# Patient Record
Sex: Male | Born: 1960 | Race: White | Hispanic: No | Marital: Married | Smoking: Never smoker
Health system: Southern US, Community
[De-identification: ages and names within clinical notes are randomized; demographics above are authoritative.]

## PROBLEM LIST (undated history)

## (undated) DIAGNOSIS — R000 Tachycardia, unspecified: Secondary | ICD-10-CM

## (undated) DIAGNOSIS — I340 Nonrheumatic mitral (valve) insufficiency: Secondary | ICD-10-CM

## (undated) DIAGNOSIS — Z86711 Personal history of pulmonary embolism: Secondary | ICD-10-CM

## (undated) DIAGNOSIS — R05 Cough: Secondary | ICD-10-CM

## (undated) DIAGNOSIS — R1013 Epigastric pain: Secondary | ICD-10-CM

## (undated) DIAGNOSIS — K759 Inflammatory liver disease, unspecified: Secondary | ICD-10-CM

## (undated) DIAGNOSIS — H9319 Tinnitus, unspecified ear: Secondary | ICD-10-CM

## (undated) DIAGNOSIS — K219 Gastro-esophageal reflux disease without esophagitis: Secondary | ICD-10-CM

## (undated) DIAGNOSIS — Z860100 Personal history of colon polyps, unspecified: Secondary | ICD-10-CM

## (undated) DIAGNOSIS — R059 Cough, unspecified: Secondary | ICD-10-CM

## (undated) DIAGNOSIS — R011 Cardiac murmur, unspecified: Secondary | ICD-10-CM

## (undated) DIAGNOSIS — J45909 Unspecified asthma, uncomplicated: Secondary | ICD-10-CM

## (undated) DIAGNOSIS — R12 Heartburn: Secondary | ICD-10-CM

## (undated) DIAGNOSIS — Z9889 Other specified postprocedural states: Secondary | ICD-10-CM

## (undated) DIAGNOSIS — Z9289 Personal history of other medical treatment: Secondary | ICD-10-CM

## (undated) HISTORY — DX: Personal history of other medical treatment: Z92.89

## (undated) HISTORY — DX: Cardiac murmur, unspecified: R01.1

## (undated) HISTORY — DX: Tachycardia, unspecified: R00.0

## (undated) HISTORY — DX: Epigastric pain: R10.13

## (undated) HISTORY — DX: Tinnitus, unspecified ear: H93.19

## (undated) HISTORY — PX: FINGER SURGERY: SHX640

## (undated) HISTORY — DX: Personal history of colonic polyps: Z86.010

## (undated) HISTORY — DX: Personal history of pulmonary embolism: Z86.711

## (undated) HISTORY — DX: Cough, unspecified: R05.9

## (undated) HISTORY — PX: SHOULDER SURGERY: SHX246

## (undated) HISTORY — DX: Heartburn: R12

## (undated) HISTORY — PX: VASECTOMY: SHX75

## (undated) HISTORY — DX: Unspecified asthma, uncomplicated: J45.909

## (undated) HISTORY — DX: Personal history of colon polyps, unspecified: Z86.0100

---
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
## 2019-06-15 ENCOUNTER — Other Ambulatory Visit: Payer: Self-pay

## 2019-06-15 ENCOUNTER — Other Ambulatory Visit (HOSPITAL_COMMUNITY)
Admission: RE | Admit: 2019-06-15 | Discharge: 2019-06-15 | Disposition: A | Discharge status: Home / Self Care | Payer: PRIVATE HEALTH INSURANCE | Source: Ambulatory Visit | Attending: Interventional Cardiology | Admitting: Interventional Cardiology

## 2019-06-15 ENCOUNTER — Encounter

## 2019-06-15 ENCOUNTER — Ambulatory Visit (INDEPENDENT_AMBULATORY_CARE_PROVIDER_SITE_OTHER): Payer: PRIVATE HEALTH INSURANCE | Admitting: Interventional Cardiology

## 2019-06-15 ENCOUNTER — Encounter (INDEPENDENT_AMBULATORY_CARE_PROVIDER_SITE_OTHER): Payer: Self-pay

## 2019-06-15 VITALS — BP 132/73 | HR 73 | Ht 75.0 in | Wt 210.8 lb

## 2019-06-15 DIAGNOSIS — Z7189 Other specified counseling: Secondary | ICD-10-CM

## 2019-06-15 DIAGNOSIS — Z20828 Contact with and (suspected) exposure to other viral communicable diseases: Secondary | ICD-10-CM | POA: Insufficient documentation

## 2019-06-15 DIAGNOSIS — Z01812 Encounter for preprocedural laboratory examination: Secondary | ICD-10-CM | POA: Insufficient documentation

## 2019-06-15 DIAGNOSIS — R0602 Shortness of breath: Secondary | ICD-10-CM | POA: Diagnosis not present

## 2019-06-15 DIAGNOSIS — I2782 Chronic pulmonary embolism: Secondary | ICD-10-CM

## 2019-06-15 DIAGNOSIS — I340 Nonrheumatic mitral (valve) insufficiency: Secondary | ICD-10-CM

## 2019-06-15 DIAGNOSIS — J4520 Mild intermittent asthma, uncomplicated: Secondary | ICD-10-CM | POA: Diagnosis not present

## 2019-06-15 LAB — SARS CORONAVIRUS 2 (TAT 6-24 HRS): SARS Coronavirus 2: NEGATIVE

## 2019-06-15 NOTE — Patient Instructions (Addendum)
Medication Instructions:  Your physician recommends that you continue on your current medications as directed. Please refer to the Current Medication list given to you today.  If you need a refill on your cardiac medications before your next appointment, please call your pharmacy.   Lab work: BMET, CBC and Pro BNP today  If you have labs (blood work) drawn today and your tests are completely normal, you will receive your results only by: Marland Kitchen MyChart Message (if you have MyChart) OR . A paper copy in the mail If you have any lab test that is abnormal or we need to change your treatment, we will call you to review the results.  Testing/Procedures: Your physician has requested that you have a cardiac catheterization. Cardiac catheterization is used to diagnose and/or treat various heart conditions. Doctors may recommend this procedure for a number of different reasons. The most common reason is to evaluate chest pain. Chest pain can be a symptom of coronary artery disease (CAD), and cardiac catheterization can show whether plaque is narrowing or blocking your heart's arteries. This procedure is also used to evaluate the valves, as well as measure the blood flow and oxygen levels in different parts of your heart. For further information please visit [email or web address]. Please follow instruction sheet, as given.   Follow-Up: At [facility], you and your health needs are our priority.  As part of our continuing mission to provide you with exceptional heart care, we have created designated Provider Care Teams.  These Care Teams include your primary Cardiologist (physician) and Advanced Practice Providers (APPs -  Physician Assistants and Nurse Practitioners) who all work together to provide you with the care you need, when you need it. You will need a follow up appointment in 1 month.  Please call our office 2 months in advance to schedule this appointment.  You may see Dr. Ericka Pontiff or one of the  following Advanced Practice Providers on your designated Care Team:   Truitt Merle, NP Cecilie Kicks, NP . Kathyrn Drown, NP  Any Other Special Instructions Will Be Listed Below (If Applicable).  You have been referred to Dr. Lanelle Bal at [facility].  You will need to see them in about 10 days. (Make sure after heart cath).       [place] OFFICE [place], [place] [address] Dept: [phone] Loc: [phone]  CLIVE PARCEL  06/15/2019  You are scheduled for a Cardiac Catheterization on Wednesday, September 30 with Dr. Daneen Schick.  1. Please arrive at the [facility] (Main Entrance A) at [facility]: [address] [place], [place] [address] at 8:00 AM (This time is two hours before your procedure to ensure your preparation). Free valet parking service is available.   Special note: Every effort is made to have your procedure done on time. Please understand that emergencies sometimes delay scheduled procedures.  2. Diet: Do not eat solid foods after midnight.  The patient may have clear liquids until 5am upon the day of the procedure.  3. Labs: You will have labs drawn today.  4. Medication instructions in preparation for your procedure:   Contrast Allergy: No  On the morning of your procedure, take your Aspirin and any morning medicines NOT listed above.  You may use sips of water.  5. Plan for one night stay--bring personal belongings. 6. Bring a current list of your medications and current insurance cards. 7. You  MUST have a responsible person to drive you home. 8. Someone MUST be with you the first 24 hours after you arrive home or your discharge will be delayed. 9. Please wear clothes that are easy to get on and off and wear slip-on shoes.  Thank you for allowing [place] to care for you!   -- [facility]

## 2019-06-16 ENCOUNTER — Telehealth: Payer: Self-pay | Admitting: *Deleted

## 2019-06-16 LAB — CBC
Hematocrit: 49.6 % (ref 37.5–51.0)
Hemoglobin: 16.7 g/dL (ref 13.0–17.7)
MCH: 28.3 pg (ref 26.6–33.0)
MCHC: 33.7 g/dL (ref 31.5–35.7)
MCV: 84 fL (ref 79–97)
Platelets: 326 10*3/uL (ref 150–450)
RBC: 5.91 x10E6/uL — ABNORMAL HIGH (ref 4.14–5.80)
RDW: 13.4 % (ref 11.6–15.4)
WBC: 7.9 10*3/uL (ref 3.4–10.8)

## 2019-06-16 LAB — BASIC METABOLIC PANEL
BUN/Creatinine Ratio: 12 (ref 9–20)
BUN: 11 mg/dL (ref 6–24)
CO2: 21 mmol/L (ref 20–29)
Calcium: 9.7 mg/dL (ref 8.7–10.2)
Chloride: 102 mmol/L (ref 96–106)
Creatinine, Ser: 0.94 mg/dL (ref 0.76–1.27)
GFR calc Af Amer: 103 mL/min/{1.73_m2} (ref >59)
GFR calc non Af Amer: 89 mL/min/{1.73_m2} (ref >59)
Glucose: 89 mg/dL (ref 65–99)
Potassium: 4.7 mmol/L (ref 3.5–5.2)
Sodium: 140 mmol/L (ref 134–144)

## 2019-06-16 LAB — PRO B NATRIURETIC PEPTIDE: NT-Pro BNP: 39 pg/mL (ref 0–210)

## 2019-06-16 NOTE — Telephone Encounter (Signed)
Pt contacted pre-catheterization scheduled at [facility] for: Wednesday June 17, 2019 10 AM Verified arrival time and place: [facility]) at: 8 AM   No solid food after midnight prior to cath, clear liquids until 5 AM day of procedure. Contrast allergy: no   AM meds can be  taken pre-cath with sip of water including: ASA 81 mg   Confirmed patient has responsible person to drive home post procedure and observe 24 hours after arriving home: yes  Currently, due to Covid-19 pandemic, only one support person will be allowed with patient. Must be the same support person for that patient's entire stay, will be screened and required to wear a mask. They will be asked to wait in the waiting room for the duration of the patient's stay.  Patients are required to wear a mask when they enter the hospital.     COVID-19 Pre-Screening Questions:  . In the past 7 to 10 days have you had a cough,  shortness of breath, headache, congestion, fever (100 or greater) body aches, chills, sore throat, or sudden loss of taste or sense of smell? Asthma/shortness of breath . Have you been around anyone with known Covid 19? no . Have you been around anyone who is awaiting Covid 19 test results in the past 7 to 10 days? no . Have you been around anyone who has been exposed to Covid 19, or has mentioned symptoms of Covid 19 within the past 7 to 10 days? no  I reviewed procedure/mask/visitor instructions, Covid-19 screening questions with patient's wife (DPR), she verbalized understanding, thanked me for call.

## 2019-06-17 ENCOUNTER — Encounter (HOSPITAL_COMMUNITY)
Admission: RE | Disposition: A | Discharge status: Home / Self Care | Payer: Self-pay | Source: Home / Self Care | Attending: Interventional Cardiology

## 2019-06-17 ENCOUNTER — Other Ambulatory Visit: Payer: Self-pay

## 2019-06-17 ENCOUNTER — Ambulatory Visit (HOSPITAL_COMMUNITY)
Admission: RE | Admit: 2019-06-17 | Discharge: 2019-06-17 | Disposition: A | Discharge status: Home / Self Care | Payer: PRIVATE HEALTH INSURANCE | Attending: Interventional Cardiology | Admitting: Interventional Cardiology

## 2019-06-17 ENCOUNTER — Encounter (HOSPITAL_COMMUNITY): Payer: Self-pay | Admitting: Interventional Cardiology

## 2019-06-17 DIAGNOSIS — Z7951 Long term (current) use of inhaled steroids: Secondary | ICD-10-CM | POA: Insufficient documentation

## 2019-06-17 DIAGNOSIS — J4520 Mild intermittent asthma, uncomplicated: Secondary | ICD-10-CM | POA: Diagnosis not present

## 2019-06-17 DIAGNOSIS — R0609 Other forms of dyspnea: Secondary | ICD-10-CM | POA: Diagnosis not present

## 2019-06-17 DIAGNOSIS — I340 Nonrheumatic mitral (valve) insufficiency: Secondary | ICD-10-CM

## 2019-06-17 DIAGNOSIS — I2782 Chronic pulmonary embolism: Secondary | ICD-10-CM | POA: Insufficient documentation

## 2019-06-17 DIAGNOSIS — I5032 Chronic diastolic (congestive) heart failure: Secondary | ICD-10-CM | POA: Diagnosis not present

## 2019-06-17 HISTORY — PX: RIGHT/LEFT HEART CATH AND CORONARY ANGIOGRAPHY: CATH118266

## 2019-06-17 LAB — POCT I-STAT EG7
Acid-base deficit: 1 mmol/L (ref 0.0–2.0)
Acid-base deficit: 1 mmol/L (ref 0.0–2.0)
Bicarbonate: 24.9 mmol/L (ref 20.0–28.0)
Bicarbonate: 25 mmol/L (ref 20.0–28.0)
Calcium, Ion: 1.2 mmol/L (ref 1.15–1.40)
Calcium, Ion: 1.21 mmol/L (ref 1.15–1.40)
HCT: 44 % (ref 39.0–52.0)
HCT: 44 % (ref 39.0–52.0)
Hemoglobin: 15 g/dL (ref 13.0–17.0)
Hemoglobin: 15 g/dL (ref 13.0–17.0)
O2 Saturation: 79 %
O2 Saturation: 82 %
Potassium: 4.2 mmol/L (ref 3.5–5.1)
Potassium: 4.2 mmol/L (ref 3.5–5.1)
Sodium: 141 mmol/L (ref 135–145)
Sodium: 141 mmol/L (ref 135–145)
TCO2: 26 mmol/L (ref 22–32)
TCO2: 26 mmol/L (ref 22–32)
pCO2, Ven: 45.1 mmHg (ref 44.0–60.0)
pCO2, Ven: 45.4 mmHg (ref 44.0–60.0)
pH, Ven: 7.347 (ref 7.250–7.430)
pH, Ven: 7.352 (ref 7.250–7.430)
pO2, Ven: 46 mmHg — ABNORMAL HIGH (ref 32.0–45.0)
pO2, Ven: 49 mmHg — ABNORMAL HIGH (ref 32.0–45.0)

## 2019-06-17 LAB — POCT I-STAT 7, (LYTES, BLD GAS, ICA,H+H)
Acid-base deficit: 1 mmol/L (ref 0.0–2.0)
Bicarbonate: 24.6 mmol/L (ref 20.0–28.0)
Calcium, Ion: 1.18 mmol/L (ref 1.15–1.40)
HCT: 42 % (ref 39.0–52.0)
Hemoglobin: 14.3 g/dL (ref 13.0–17.0)
O2 Saturation: 97 %
Potassium: 4.1 mmol/L (ref 3.5–5.1)
Sodium: 137 mmol/L (ref 135–145)
TCO2: 26 mmol/L (ref 22–32)
pCO2 arterial: 44.8 mmHg (ref 32.0–48.0)
pH, Arterial: 7.348 — ABNORMAL LOW (ref 7.350–7.450)
pO2, Arterial: 98 mmHg (ref 83.0–108.0)

## 2019-06-17 SURGERY — RIGHT/LEFT HEART CATH AND CORONARY ANGIOGRAPHY
Anesthesia: LOCAL

## 2019-06-17 MED ORDER — ACETAMINOPHEN 325 MG PO TABS: 650.0000 mg | ORAL_TABLET | ORAL | Status: DC | PRN

## 2019-06-17 MED ORDER — ASPIRIN 81 MG PO CHEW: 81.0000 mg | CHEWABLE_TABLET | ORAL | Status: DC

## 2019-06-17 MED ORDER — SODIUM CHLORIDE 0.9% FLUSH: 3.0000 mL | Freq: Two times a day (BID) | INTRAVENOUS | Status: DC

## 2019-06-17 MED ORDER — LIDOCAINE HCL (PF) 1 % IJ SOLN
INTRAMUSCULAR | Status: DC | PRN
  Administered 2019-06-17 (×2): 2 mL via INTRADERMAL

## 2019-06-17 MED ORDER — HEPARIN (PORCINE) IN NACL 1000-0.9 UT/500ML-% IV SOLN
INTRAVENOUS | Status: AC
  Filled 2019-06-17: qty 500

## 2019-06-17 MED ORDER — HYDRALAZINE HCL 20 MG/ML IJ SOLN: 10.0000 mg | INTRAMUSCULAR | Status: DC | PRN

## 2019-06-17 MED ORDER — VERAPAMIL HCL 2.5 MG/ML IV SOLN
INTRAVENOUS | Status: DC | PRN
  Administered 2019-06-17: 11:00:00 10 mL via INTRA_ARTERIAL

## 2019-06-17 MED ORDER — FENTANYL CITRATE (PF) 100 MCG/2ML IJ SOLN
INTRAMUSCULAR | Status: AC
  Filled 2019-06-17: qty 2

## 2019-06-17 MED ORDER — SODIUM CHLORIDE 0.9 % IV SOLN: 250.0000 mL | INTRAVENOUS | Status: DC | PRN

## 2019-06-17 MED ORDER — SODIUM CHLORIDE 0.9% FLUSH: 3.0000 mL | INTRAVENOUS | Status: DC | PRN

## 2019-06-17 MED ORDER — IOHEXOL 350 MG/ML SOLN
INTRAVENOUS | Status: DC | PRN
  Administered 2019-06-17: 11:00:00 120 mL via INTRACARDIAC

## 2019-06-17 MED ORDER — VERAPAMIL HCL 2.5 MG/ML IV SOLN
INTRAVENOUS | Status: AC
  Filled 2019-06-17: qty 2

## 2019-06-17 MED ORDER — ONDANSETRON HCL 4 MG/2ML IJ SOLN: 4.0000 mg | Freq: Four times a day (QID) | INTRAMUSCULAR | Status: DC | PRN

## 2019-06-17 MED ORDER — MIDAZOLAM HCL 2 MG/2ML IJ SOLN
INTRAMUSCULAR | Status: DC | PRN
  Administered 2019-06-17 (×3): 1 mg via INTRAVENOUS

## 2019-06-17 MED ORDER — SODIUM CHLORIDE 0.9 % WEIGHT BASED INFUSION
3.0000 mL/kg/h | INTRAVENOUS | Status: AC
  Administered 2019-06-17: 09:00:00 3 mL/kg/h via INTRAVENOUS

## 2019-06-17 MED ORDER — SODIUM CHLORIDE 0.9 % WEIGHT BASED INFUSION: 1.0000 mL/kg/h | INTRAVENOUS | Status: DC

## 2019-06-17 MED ORDER — LABETALOL HCL 5 MG/ML IV SOLN: 10.0000 mg | INTRAVENOUS | Status: DC | PRN

## 2019-06-17 MED ORDER — HEPARIN (PORCINE) IN NACL 1000-0.9 UT/500ML-% IV SOLN
INTRAVENOUS | Status: DC | PRN
  Administered 2019-06-17 (×2): 500 mL

## 2019-06-17 MED ORDER — FENTANYL CITRATE (PF) 100 MCG/2ML IJ SOLN
INTRAMUSCULAR | Status: DC | PRN
  Administered 2019-06-17 (×2): 25 ug via INTRAVENOUS

## 2019-06-17 MED ORDER — HEPARIN SODIUM (PORCINE) 1000 UNIT/ML IJ SOLN
INTRAMUSCULAR | Status: AC
  Filled 2019-06-17: qty 1

## 2019-06-17 MED ORDER — HEPARIN SODIUM (PORCINE) 1000 UNIT/ML IJ SOLN
INTRAMUSCULAR | Status: DC | PRN
  Administered 2019-06-17: 5000 [IU] via INTRAVENOUS

## 2019-06-17 MED ORDER — SODIUM CHLORIDE 0.9 % IV SOLN: INTRAVENOUS | Status: DC

## 2019-06-17 MED ORDER — MIDAZOLAM HCL 2 MG/2ML IJ SOLN
INTRAMUSCULAR | Status: AC
  Filled 2019-06-17: qty 2

## 2019-06-17 MED ORDER — LIDOCAINE HCL (PF) 1 % IJ SOLN
INTRAMUSCULAR | Status: AC
  Filled 2019-06-17: qty 30

## 2019-06-17 SURGICAL SUPPLY — 13 items
CATH 5FR JL3.5 JR4 ANG PIG MP (CATHETERS) ×2 IMPLANT
CATH BALLN WEDGE 5F 110CM (CATHETERS) ×2 IMPLANT
DEVICE RAD COMP TR BAND LRG (VASCULAR PRODUCTS) ×2 IMPLANT
GLIDESHEATH SLEND A-KIT 6F 22G (SHEATH) ×2 IMPLANT
GUIDEWIRE INQWIRE 1.5J.035X260 (WIRE) ×1 IMPLANT
INQWIRE 1.5J .035X260CM (WIRE) ×2
KIT HEART LEFT (KITS) ×2 IMPLANT
PACK CARDIAC CATHETERIZATION (CUSTOM PROCEDURE TRAY) ×2 IMPLANT
SHEATH GLIDE SLENDER 4/5FR (SHEATH) ×2 IMPLANT
SHEATH PROBE COVER 6X72 (BAG) ×2 IMPLANT
TRANSDUCER W/STOPCOCK (MISCELLANEOUS) ×2 IMPLANT
TUBING CIL FLEX 10 FLL-RA (TUBING) ×2 IMPLANT
WIRE EMERALD 3MM-J .035X150CM (WIRE) IMPLANT

## 2019-06-17 NOTE — CV Procedure (Addendum)
   Right and left heart catheterization with coronary angiography using right antecubital vein and radial artery.  Real-time vascular ultrasound used for arterial access.  Normal coronary arteries.  Normal pulmonary artery pressures.  No V wave.  Pulmonary capillary wedge mean pressure 6 mmHg.  Normal left ventricular systolic function.  LVEDP 7 mmHg.  EF 60%.  Mitral regurgitation appears mild to moderate angiographically..  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  Left atrium noted to be mildly dilated.  Will refer to Dr. Roxy Manns for consideration of valve repair given the patient's significant exertional dyspnea which has no other explanation than MR. He may need concurrent pulmonary evaluation to be certain that dyspnea is correctly attributed.

## 2019-06-17 NOTE — Research (Signed)
[place] Informed Consent   Subject Name: Nathan [name]  Subject met inclusion and exclusion criteria.  The informed consent form, study requirements and expectations were reviewed with the subject and questions and concerns were addressed prior to the signing of the consent form.  The subject verbalized understanding of the trial requirements.  The subject agreed to participate in the [facility] trial and signed the informed consent at 0925 on 06/17/2019.  The informed consent was obtained prior to performance of any protocol-specific procedures for the subject.  A copy of the signed informed consent was given to the subject and a copy was placed in the subject's medical record.   [name]

## 2019-06-17 NOTE — Interval H&P Note (Signed)
Cath Lab Visit (complete for each Cath Lab visit)  Clinical Evaluation Leading to the Procedure:   ACS: No.  Non-ACS:    Anginal Classification: No Symptoms  Anti-ischemic medical therapy: No Therapy  Non-Invasive Test Results: No non-invasive testing performed  Prior CABG: No previous CABG      History and Physical Interval Note:  06/17/2019 10:20 AM  Nathan [name]  has presented today for surgery, with the diagnosis of shortness of breath.  The various methods of treatment have been discussed with the patient and family. After consideration of risks, benefits and other options for treatment, the patient has consented to  Procedure(s): RIGHT/LEFT HEART CATH AND CORONARY ANGIOGRAPHY (N/A) as a surgical intervention.  The patient's history has been reviewed, patient examined, no change in status, stable for surgery.  I have reviewed the patient's chart and labs.  Questions were answered to the patient's satisfaction.     Belva Crome III

## 2019-06-17 NOTE — Discharge Instructions (Signed)
Drink plenty of fluids °Keep right arm at or above heart level  °Radial Site Care ° °This sheet gives you information about how to care for yourself after your procedure. Your health care provider may also give you more specific instructions. If you have problems or questions, contact your health care provider. °What can I expect after the procedure? °After the procedure, it is common to have: °· Bruising and tenderness at the catheter insertion area. °Follow these instructions at home: °Medicines °· Take over-the-counter and prescription medicines only as told by your health care provider. °Insertion site care °· Follow instructions from your health care provider about how to take care of your insertion site. Make sure you: °? Wash your hands with soap and water before you change your bandage (dressing). If soap and water are not available, use hand sanitizer. °? Change your dressing as told by your health care provider. °? Leave stitches (sutures), skin glue, or adhesive strips in place. These skin closures may need to stay in place for 2 weeks or longer. If adhesive strip edges start to loosen and curl up, you may trim the loose edges. Do not remove adhesive strips completely unless your health care provider tells you to do that. °· Check your insertion site every day for signs of infection. Check for: °? Redness, swelling, or pain. °? Fluid or blood. °? Pus or a bad smell. °? Warmth. °· Do not take baths, swim, or use a hot tub until your health care provider approves. °· You may shower 24-48 hours after the procedure, or as directed by your health care provider. °? Remove the dressing and gently wash the site with plain soap and water. °? Pat the area dry with a clean towel. °? Do not rub the site. That could cause bleeding. °· Do not apply powder or lotion to the site. °Activity ° °· For 24 hours after the procedure, or as directed by your health care provider: °? Do not flex or bend the affected arm. °? Do  not push or pull heavy objects with the affected arm. °? Do not drive yourself home from the hospital or clinic. You may drive 24 hours after the procedure unless your health care provider tells you not to. °? Do not operate machinery or power tools. °· Do not lift anything that is heavier than 10 lb (4.5 kg), or the limit that you are told, until your health care provider says that it is safe. °· Ask your health care provider when it is okay to: °? Return to work or school. °? Resume usual physical activities or sports. °? Resume sexual activity. °General instructions °· If the catheter site starts to bleed, raise your arm and put firm pressure on the site. If the bleeding does not stop, get help right away. This is a medical emergency. °· If you went home on the same day as your procedure, a responsible adult should be with you for the first 24 hours after you arrive home. °· Keep all follow-up visits as told by your health care provider. This is important. °Contact a health care provider if: °· You have a fever. °· You have redness, swelling, or yellow drainage around your insertion site. °Get help right away if: °· You have unusual pain at the radial site. °· The catheter insertion area swells very fast. °· The insertion area is bleeding, and the bleeding does not stop when you hold steady pressure on the area. °· Your arm or hand   becomes pale, cool, tingly, or numb. °These symptoms may represent a serious problem that is an emergency. Do not wait to see if the symptoms will go away. Get medical help right away. Call your local emergency services (911 in the [place] Do not drive yourself to the hospital. °Summary °· After the procedure, it is common to have bruising and tenderness at the site. °· Follow instructions from your health care provider about how to take care of your radial site wound. Check the wound every day for signs of infection. °· Do not lift anything that is heavier than 10 lb (4.5 kg), or the  limit that you are told, until your health care provider says that it is safe. °This information is not intended to replace advice given to you by your health care provider. Make sure you discuss any questions you have with your health care provider. °Document Released: 10/06/2010 Document Revised: 10/09/2017 Document Reviewed: 10/09/2017 °Elsevier Patient Education © 2020 [employer] ° °

## 2019-06-18 ENCOUNTER — Encounter: Payer: Self-pay | Admitting: Thoracic Surgery (Cardiothoracic Vascular Surgery)

## 2019-06-18 ENCOUNTER — Other Ambulatory Visit: Payer: Self-pay | Admitting: Thoracic Surgery (Cardiothoracic Vascular Surgery)

## 2019-06-18 ENCOUNTER — Other Ambulatory Visit: Payer: Self-pay | Admitting: *Deleted

## 2019-06-18 ENCOUNTER — Institutional Professional Consult (permissible substitution) (INDEPENDENT_AMBULATORY_CARE_PROVIDER_SITE_OTHER): Payer: PRIVATE HEALTH INSURANCE | Admitting: Thoracic Surgery (Cardiothoracic Vascular Surgery)

## 2019-06-18 ENCOUNTER — Other Ambulatory Visit: Payer: Self-pay

## 2019-06-18 ENCOUNTER — Encounter: Payer: PRIVATE HEALTH INSURANCE | Admitting: Cardiothoracic Surgery

## 2019-06-18 VITALS — BP 143/85 | HR 58 | Temp 98.1°F | Resp 18 | Ht 75.0 in | Wt 205.0 lb

## 2019-06-18 DIAGNOSIS — I340 Nonrheumatic mitral (valve) insufficiency: Secondary | ICD-10-CM

## 2019-06-18 NOTE — Patient Instructions (Signed)
   Continue taking all current medications without change through the day before surgery.  Have nothing to eat or drink after midnight the night before surgery.  On the morning of surgery take only Protonix with a sip of water.  You may use your inhaler.

## 2019-06-18 NOTE — Progress Notes (Signed)
° ° °   °[address] °      [place] [address] °            [phone]   ° ° °CARDIOTHORACIC SURGERY CONSULTATION REPORT ° °Referring Provider is Smith, Henry W, MD °PCP is Hollandsworth, James, MD ° °Chief Complaint  °Patient presents with  °• Mitral Regurgitation  °  new patient consultation, Cath 9/30, ECHO 6/5  ° ° °HPI: ° °Patient is a 58-year-old male with mitral valve prolapse who has been referred for surgical consultation to discuss treatment options for management of severe symptomatic primary mitral regurgitation. ° °Patient states that he first began to experience symptoms of exertional shortness of breath 4 or 5 years ago.  Symptoms developed somewhat suddenly and have persisted and gradually gotten worse.  The patient is fairly active physically but has noticed a definite decline in his tolerance with a tendency to get breathless during activity.  He has a persistent cough that is described as "hacky" and occasionally productive of thick sputum.  He reports occasional vague discomfort in his chest primarily associated with a sensation that is heart is pounding or racing.  He denies resting shortness of breath, PND, orthopnea, or lower extremity edema.  He has occasional very slight dizzy spells.  More than 6 months ago he was seen at his primary care physician's office by a provider who noted the presence of a systolic murmur on physical exam.  The patient underwent transthoracic and transesophageal echocardiograms at [facility] which revealed mitral valve prolapse with severe mitral regurgitation and normal left ventricular systolic function.  The patient ultimately was self-referred to our practice and underwent diagnostic cardiac catheterization by Dr. Smith on June 17, 2019.  Catheterization revealed normal coronary artery anatomy with no significant coronary artery disease.  There was at least moderate mitral regurgitation.  Right heart pressures were  normal.  Cardiothoracic surgical consultation was requested. ° °Patient is married and lives with his wife in [place].  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  This work keeps him quite busy with long days.  He does not exercise otherwise on his own but he does remain physically active and reports no other significant physical limitations. ° °Past Medical History:  °Diagnosis Date  °• Allergic asthma   °• Cough   °• Epigastric pain   °• Fast heart beat   °• Heartburn   °• History of pulmonary embolus (PE)   °• History of transesophageal echocardiography (TEE)   ° ABNORMAL  °• Hx of colonic polyps   °• Murmur, cardiac   °• Ringing in ear   ° ° °Past Surgical History:  °Procedure Laterality Date  °• FINGER SURGERY    °• RIGHT/LEFT HEART CATH AND CORONARY ANGIOGRAPHY N/A 06/17/2019  ° Procedure: RIGHT/LEFT HEART CATH AND CORONARY ANGIOGRAPHY;  Surgeon: Smith, Henry W, MD;  Location: [facility];  Service: Cardiovascular;  Laterality: N/A;  °• SHOULDER SURGERY Left   °• VASECTOMY    ° ° °Family History  °Problem Relation Age of Onset  °• Diabetes Mother   ° ° °Social History  ° °Socioeconomic History  °• Marital status: Married  °  Spouse name: Not on file  °• Number of children: Not on file  °• Years of education: Not on file  °• Highest education level: Not on file  °Occupational History  °• Not on file  °Social Needs  °•   Financial resource strain: Not on file  °• Food insecurity  °  Worry: Not on file  °  Inability: Not on file  °• Transportation needs  °  Medical: Not on file  °  Non-medical: Not on file  °Tobacco Use  °• Smoking status: Never Smoker  °• Smokeless tobacco: Never Used  °Substance and Sexual Activity  °• Alcohol use: Yes  °  Alcohol/week: 6.0 standard drinks  °  Types: 6 Glasses of wine per week  °• Drug use: Never  °• Sexual activity: Not on file  °  Comment: MARRIED  °Lifestyle  °• Physical activity  °  Days per week: Not on  file  °  Minutes per session: Not on file  °• Stress: Not on file  °Relationships  °• Social connections  °  Talks on phone: Not on file  °  Gets together: Not on file  °  Attends religious service: Not on file  °  Active member of club or organization: Not on file  °  Attends meetings of clubs or organizations: Not on file  °  Relationship status: Not on file  °• Intimate partner violence  °  Fear of current or ex partner: Not on file  °  Emotionally abused: Not on file  °  Physically abused: Not on file  °  Forced sexual activity: Not on file  °Other Topics Concern  °• Not on file  °Social History Narrative  °• Not on file  ° ° °Current Outpatient Medications  °Medication Sig Dispense Refill  °• budesonide (RHINOCORT AQUA) 32 MCG/ACT nasal spray Place 2 sprays into both nostrils daily.     °• budesonide-formoterol (SYMBICORT) 80-4.5 MCG/ACT inhaler Inhale 1-2 puffs into the lungs See admin instructions. Inhale 2 puffs in the morning, may inhale 1 puff during the day as needed for shortness of breath    °• Guaifenesin (MUCINEX MAXIMUM STRENGTH) 1200 MG TB12 Take 1,200 mg by mouth daily as needed (congestion).    °• omeprazole (PRILOSEC) 40 MG capsule Take 40 mg by mouth daily.    °• pantoprazole (PROTONIX) 40 MG tablet Take 40 mg by mouth daily.    ° °No current facility-administered medications for this visit.   ° ° °No Known Allergies ° ° ° °Review of Systems: ° ° General:  normal appetite, normal energy, no weight gain, no weight loss, no fever ° Cardiac:  no chest pain with exertion, no chest pain at rest, +SOB with exertion, no resting SOB, no PND, no orthopnea, + palpitations, no arrhythmia, no atrial fibrillation, no LE edema, no dizzy spells, no syncope ° Respiratory:  + shortness of breath, no home oxygen, occasional productive cough, chronic dry cough, no bronchitis, + wheezing, no hemoptysis, + asthma, no pain with inspiration or cough, no sleep apnea, no CPAP at night ° GI:   no difficulty swallowing, +  reflux, + frequent heartburn, no hiatal hernia, no abdominal pain, no constipation, no diarrhea, no hematochezia, no hematemesis, no melena ° GU:   no dysuria,  no frequency, no urinary tract infection, no hematuria, no enlarged prostate, no kidney stones, no kidney disease ° Vascular:  no pain suggestive of claudication, no pain in feet, no leg cramps, no varicose veins, no DVT, no non-healing foot ulcer ° Neuro:   no stroke, no TIA's, no seizures, no headaches, no temporary blindness one eye,  no slurred speech, no peripheral neuropathy, no chronic pain, no instability of gait, no memory/cognitive dysfunction ° Musculoskeletal: no   arthritis, no joint swelling, no myalgias, no difficulty walking, normal mobility  ° Skin:   no rash, no itching, no skin infections, no pressure sores or ulcerations ° Psych:   no anxiety, no depression, no nervousness, no unusual recent stress ° Eyes:   no blurry vision, + floaters, no recent vision changes, does not wear glasses or contacts ° ENT:   no hearing loss, no loose or painful teeth but one "cracked" tooth that is intermittently sensitive, no dentures, last saw dentist within the past year ° Hematologic:  no easy bruising, no abnormal bleeding, no clotting disorder, no frequent epistaxis ° Endocrine:  no diabetes, does not check CBG's at home ° °   °Physical Exam: ° ° BP (!) 143/85 (BP Location: Left Arm, Patient Position: Sitting, Cuff Size: Normal)    Pulse (!) 58    Temp 98.1 °F (36.7 °C)    Resp 18    Ht 6' 3" (1.905 m)    Wt 205 lb (93 kg)    SpO2 96% Comment: RA   BMI 25.62 kg/m²  ° General:    well-appearing ° HEENT:  Unremarkable  ° Neck:   no JVD, no bruits, no adenopathy  ° Chest:   clear to auscultation, symmetrical breath sounds, no wheezes, no rhonchi  ° CV:   RRR, grade III/VI holosystolic murmur best at apex ° Abdomen:  soft, non-tender, no masses  ° Extremities:  warm, well-perfused, pulses palpable, no LE edema ° Rectal/GU  Deferred ° Neuro:   Grossly  non-focal and symmetrical throughout ° Skin:   Clean and dry, no rashes, no breakdown ° ° °Diagnostic Tests: ° °ECHOCARDIOGRAMS ° °Both images and reports from transthoracic and transesophageal echocardiograms performed at [facility] in June 2020 have been reviewed.  The patient has normal left ventricular systolic function.  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  The jet of mitral regurgitation is eccentric and graded severe on transesophageal echocardiogram.  There appears to be ruptured primary chordae tendinae.  There is mild left atrial enlargement with normal left ventricular size and function.  No other significant abnormalities are noted. ° ° ° °RIGHT/LEFT HEART CATH AND CORONARY ANGIOGRAPHY  °Conclusion ° °· Mild to moderate mitral regurgitation by left ventriculography. °· Normal pulmonary artery hemodynamics including wedge pressure without significant V wave. °· Normal right dominant coronary anatomy. °· Normal coronary arteries. °· Normal LV function with EF at least 60%.  LVEDP is normal. °  °RECOMMENDATIONS: °  °· The patient does have significantly exertional dyspnea.  Dyspnea seems out of proportion to resting hemodynamics. °· Recent transesophageal echo revealed P2 prolapse with moderate to severe mitral regurgitation ([facility]). °· Plan to refer patient to Dr. Madisan Bice for further evaluation. °· May need pulmonary evaluation.  °Recommendations ° °Antiplatelet/Anticoag No indication for antiplatelet therapy at this time .  °Surgeon Notes ° °  06/17/2019 11:08 AM CV Procedure addendum by Smith, Henry W, MD  °Indications ° °Nonrheumatic mitral valve regurgitation [I34.0 (ICD-10-CM)]  °DOE (dyspnea on exertion) [R06.09 (ICD-10-CM)]  °Procedural Details ° °Technical Details The right radial area was sterilely prepped and  draped. Intravenous sedation with Versed and fentanyl was administered. 1% Xylocaine was infiltrated to achieve local analgesia. Using real-time vascular ultrasound, a double wall stick with an angiocath was utilized to obtain intra-arterial access. A VUS image was saved for the permanent record.The modified   Seldinger technique was used to place a 5F " Slender" sheath in the right radial artery. Weight based heparin was administered. Coronary angiography was done using 5 F catheters. Right coronary angiography was performed with a JR4. Left coronary angiography was performed with a JL 3.5 cm.  After coronary angiography, an angled 5 French pigtail was advanced across the aortic valve.  Hemodynamic recordings were performed.  Left ventriculography in the 30 degree RAO projection was performed using 18 cc of contrast for a total of 45 cc.  Mild to moderate mitral regurgitation was noted.  Ventricular ectopy did occur during injection.  An aortic arterial saturation was also performed. ° °Right heart catheterization was performed by exchanging a previously placed antecubital IV angio-cath for a 5 French Slender sheath. 1% Xylocaine was used to locally nesthetize the area around the IV site. The IV catheter was wired using an .018 guidewire. The modified Seldinger technique was used to place the 5 French sheath. Double glove technique was used to enhance sterility. After sheath insertion, right heart cath was performed using a 5 French balloon tipped catheter and fluoroscopic guidance. Pressures were recorded in each chamber and in the pulmonary capillary wedge position.. The main pulmonary artery O2 saturation was sampled.  ° °Hemostasis was achieved using a pneumatic band. ° °During this procedure the patient is administered a total of Versed 3 mg and Fentanyl 75 mcg to achieve and maintain moderate conscious sedation.  The patient's heart rate, blood pressure, and oxygen saturation are monitored continuously during  the procedure. The period of conscious sedation is 45 minutes, of which I was present face-to-face 100% of this time. °Estimated blood loss <50 mL.  ° °During this procedure medications were administered to achieve and maintain moderate conscious sedation while the patient's heart rate, blood pressure, and oxygen saturation were continuously monitored and I was present face-to-face 100% of this time.  °Medications °(Filter: Administrations occurring from 06/17/19 1002 to 06/17/19 1106) °(important)  Continuous medications are totaled by the amount administered until 06/17/19 1106.  °Medication Rate/Dose/Volume Action  Date Time   °fentaNYL (SUBLIMAZE) injection (mcg) 25 mcg Given 06/17/19 1016   °Total dose as of 06/17/19 1106 25 mcg Given 1030   °50 mcg        °midazolam (VERSED) injection (mg) 1 mg Given 06/17/19 1017   °Total dose as of 06/17/19 1106 1 mg Given 1029   °3 mg 1 mg Given 1043   °Heparin (Porcine) in NaCl 1000-0.9 UT/500ML-% SOLN (mL) 500 mL Given 06/17/19 1017   °Total dose as of 06/17/19 1106 500 mL Given 1017   °1,000 mL        °lidocaine (PF) (XYLOCAINE) 1 % injection (mL) 2 mL Given 06/17/19 1030   °Total dose as of 06/17/19 1106 2 mL Given 1031   °4 mL        °Radial Cocktail/Verapamil only (mL) 10 mL Given 06/17/19 1034   °Total dose as of 06/17/19 1106        °10 mL        °heparin injection (Units) 5,000 Units Given 06/17/19 1041   °Total dose as of 06/17/19 1106        °5,000 Units        °iohexol (OMNIPAQUE) 350 MG/ML injection (mL) 120 mL Given 06/17/19 1103   °Total dose as of 06/17/19 1106        °120 mL        °Sedation Time ° °Sedation Time Physician-1: 43 minutes 15   seconds  °Contrast ° °Medication Name Total Dose  °iohexol (OMNIPAQUE) 350 MG/ML injection 120 mL  °  °Radiation/Fluoro ° °Fluoro time: 5.6 (min) °DAP: 15467 (mGycm2) °Cumulative Air Kerma: 282 (mGy)  °Coronary Findings ° °Diagnostic °Dominance: Right °Ramus Intermedius  °Vessel is large.  °Left Circumflex  °Vessel is  small.  °First Obtuse Marginal Branch  °Vessel is small in size.  °Second Obtuse Marginal Branch  °Vessel is small in size.  °Third Obtuse Marginal Branch  °Vessel is small in size.  °Intervention ° °No interventions have been documented. °Right Heart ° °Right Heart Pressures Hemodynamic findings consistent with pulmonary hypertension and mitral valve regurgitation. LV EDP is normal. The pulmonary capillary wedge mean pressure is low normal.  No V wave is noted.  °Wall Motion ° °Resting    °   °  °  °  °  °Left Heart ° °Left Ventricle The left ventricular size is normal. The left ventricular systolic function is normal. LV end diastolic pressure is normal. The left ventricular ejection fraction is 55-65% by visual estimate. There is mild to moderate mitral regurgitation.  °Coronary Diagrams ° °Diagnostic °Dominance: Right ° °Intervention ° °Implants    °No implant documentation for this case.  °Syngo Images ° ° Show images for CARDIAC CATHETERIZATION  °Images on Long Term Storage ° ° Show images for Presswood, Murel W  ° Link to Procedure Log ° °Procedure Log  °  °Hemo Data ° ° Most Recent Value  °Fick Cardiac Output 7.25 L/min  °Fick Cardiac Output Index 3.25 (L/min)/BSA  °RA A Wave 5 mmHg  °RA V Wave 2 mmHg  °RA Mean 1 mmHg  °RV Systolic Pressure 23 mmHg  °RV Diastolic Pressure 1 mmHg  °RV EDP 4 mmHg  °PA Systolic Pressure 20 mmHg  °PA Diastolic Pressure 7 mmHg  °PA Mean 12 mmHg  °PW A Wave 9 mmHg  °PW V Wave 8 mmHg  °PW Mean 6 mmHg  °AO Systolic Pressure 114 mmHg  °AO Diastolic Pressure 74 mmHg  °AO Mean 91 mmHg  °LV Systolic Pressure 107 mmHg  °LV Diastolic Pressure 2 mmHg  °LV EDP 7 mmHg  °AOp Systolic Pressure 112 mmHg  °AOp Diastolic Pressure 65 mmHg  °AOp Mean Pressure 86 mmHg  °LVp Systolic Pressure 103 mmHg  °LVp Diastolic Pressure 4 mmHg  °LVp EDP Pressure 7 mmHg  °QP/QS 1  °TPVR Index 3.69 HRUI  °TSVR Index 27.96 HRUI  °PVR SVR Ratio 0.07  °TPVR/TSVR Ratio 0.13  ° ° ° ° °Impression: ° °Patient has mitral  valve prolapse with stage D severe symptomatic primary mitral regurgitation.  He describes a 4 to 5-year history of exertional shortness of breath that has been progressive over time, consistent with chronic diastolic congestive heart failure, New [place] Heart Association functional class II.  I have personally reviewed the patient's transthoracic and transesophageal echocardiograms performed June 2020.  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  There is normal left ventricular size and systolic function.  There are no other complicating features.  Diagnostic cardiac catheterization reveals normal coronary artery anatomy and normal right heart pressures. ° °Patient needs elective mitral valve repair.  Based upon review of the patient's history, examination, and diagnostic tests I would anticipate that elective surgical intervention would come with less than 1% risk of mortality or major complication and better than 98% likelihood of successful and durable mitral valve   repair.  The patient appears to be good candidate for minimally invasive approach for surgery. ° ° °Plan: ° °The patient and his wife counseled at length regarding the indications, risks and potential benefits of mitral valve repair.  The rationale for elective surgery has been explained, including a comparison between surgery and continued medical therapy with close follow-up.  The likelihood of successful and durable mitral valve repair has been discussed with particular reference to the findings of their recent echocardiogram.  Based upon these findings and previous experience, I have quoted them a greater than 98 percent likelihood of successful valve repair with less than 1 percent risk of mortality or major morbidity.  Alternative surgical approaches have been discussed including a comparison  between conventional sternotomy and minimally-invasive techniques.  The relative risks and benefits of each have been reviewed as they pertain to the patient's specific circumstances, and expectations for the patient's postoperative convalescence has been discussed.  Patient desires to proceed with surgery as soon as practical.  As a neck step he will undergo CT angiography of the aorta and iliac vessels to evaluate the feasibility of peripheral cannulation for surgery.  We tentatively plan to proceed with mitral valve repair on June 23, 2019. ° °The patient understands and accepts all potential risks of surgery including but not limited to risk of death, stroke or other neurologic complication, myocardial infarction, congestive heart failure, respiratory failure, renal failure, bleeding requiring transfusion and/or reexploration, arrhythmia, infection or other wound complications, pneumonia, pleural and/or pericardial effusion, pulmonary embolus, aortic dissection or other major vascular complication, or delayed complications related to valve repair or replacement including but not limited to structural valve deterioration and failure, thrombosis, embolization, endocarditis, or paravalvular leak.  Specific risks potentially related to the minimally-invasive approach were discussed at length, including but not limited to risk of conversion to full or partial sternotomy, aortic dissection or other major vascular complication, unilateral acute lung injury or pulmonary edema, phrenic nerve dysfunction or paralysis, rib fracture, chronic pain, lung hernia, or lymphocele. All of their questions have been answered. ° ° ° °I spent in excess of 90 minutes during the conduct of this office consultation and >50% of this time involved direct face-to-face encounter with the patient for counseling and/or coordination of their care. ° ° ° °Milyn Stapleton H. Junita Kubota, MD °06/18/2019 °2:32 PM ° °

## 2019-06-18 NOTE — H&P (View-Only) (Signed)
[address]       [place] [address]             [phone]     CARDIOTHORACIC SURGERY CONSULTATION REPORT  Referring Provider is Lyn Records, MD PCP is Orlie Dakin, MD  Chief Complaint  Patient presents with   Mitral Regurgitation    new patient consultation, Cath 9/30, ECHO 6/5    HPI:  Patient is a 58 year old male with mitral valve prolapse who has been referred for surgical consultation to discuss treatment options for management of severe symptomatic primary mitral regurgitation.  Patient states that he first began to experience symptoms of exertional shortness of breath 4 or 5 years ago.  Symptoms developed somewhat suddenly and have persisted and gradually gotten worse.  The patient is fairly active physically but has noticed a definite decline in his tolerance with a tendency to get breathless during activity.  He has a persistent cough that is described as "hacky" and occasionally productive of thick sputum.  He reports occasional vague discomfort in his chest primarily associated with a sensation that is heart is pounding or racing.  He denies resting shortness of breath, PND, orthopnea, or lower extremity edema.  He has occasional very slight dizzy spells.  More than 6 months ago he was seen at his primary care physician's office by a provider who noted the presence of a systolic murmur on physical exam.  The patient underwent transthoracic and transesophageal echocardiograms at [facility] which revealed mitral valve prolapse with severe mitral regurgitation and normal left ventricular systolic function.  The patient ultimately was self-referred to our practice and underwent diagnostic cardiac catheterization by Dr. Katrinka Blazing on June 17, 2019.  Catheterization revealed normal coronary artery anatomy with no significant coronary artery disease.  There was at least moderate mitral regurgitation.  Right heart pressures were  normal.  Cardiothoracic surgical consultation was requested.  Patient is married and lives with his wife in [place].  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  This work keeps him quite busy with long days.  He does not exercise otherwise on his own but he does remain physically active and reports no other significant physical limitations.  Past Medical History:  Diagnosis Date   Allergic asthma    Cough    Epigastric pain    Fast heart beat    Heartburn    History of pulmonary embolus (PE)    History of transesophageal echocardiography (TEE)    ABNORMAL   Hx of colonic polyps    Murmur, cardiac    Ringing in ear     Past Surgical History:  Procedure Laterality Date   FINGER SURGERY     RIGHT/LEFT HEART CATH AND CORONARY ANGIOGRAPHY N/A 06/17/2019   Procedure: RIGHT/LEFT HEART CATH AND CORONARY ANGIOGRAPHY;  Surgeon: Lyn Records, MD;  Location: [facility] INV[facility];  Service: Cardiovascular;  Laterality: N/A;   SHOULDER SURGERY Left    VASECTOMY      Family History  Problem Relation Age of Onset   Diabetes Mother     Social History   Socioeconomic History   Marital status: Married    Spouse name: Not on file   Number of children: Not on file   Years of education: Not on file   Highest education level: Not on file  Occupational History   Not on file  Social Needs  Financial resource strain: Not on file   Food insecurity    Worry: Not on file    Inability: Not on file   Transportation needs    Medical: Not on file    Non-medical: Not on file  Tobacco Use   Smoking status: Never Smoker   Smokeless tobacco: Never Used  Substance and Sexual Activity   Alcohol use: Yes    Alcohol/week: 6.0 standard drinks    Types: 6 Glasses of wine per week   Drug use: Never   Sexual activity: Not on file    Comment: MARRIED  Lifestyle   Physical activity    Days per week: Not on  file    Minutes per session: Not on file   Stress: Not on file  Relationships   Social connections    Talks on phone: Not on file    Gets together: Not on file    Attends religious service: Not on file    Active member of club or organization: Not on file    Attends meetings of clubs or organizations: Not on file    Relationship status: Not on file   Intimate partner violence    Fear of current or ex partner: Not on file    Emotionally abused: Not on file    Physically abused: Not on file    Forced sexual activity: Not on file  Other Topics Concern   Not on file  Social History Narrative   Not on file    Current Outpatient Medications  Medication Sig Dispense Refill   budesonide (RHINOCORT AQUA) 32 MCG/ACT nasal spray Place 2 sprays into both nostrils daily.      budesonide-formoterol (SYMBICORT) 80-4.5 MCG/ACT inhaler Inhale 1-2 puffs into the lungs See admin instructions. Inhale 2 puffs in the morning, may inhale 1 puff during the day as needed for shortness of breath     Guaifenesin (MUCINEX MAXIMUM STRENGTH) 1200 MG TB12 Take 1,200 mg by mouth daily as needed (congestion).     omeprazole (PRILOSEC) 40 MG capsule Take 40 mg by mouth daily.     pantoprazole (PROTONIX) 40 MG tablet Take 40 mg by mouth daily.     No current facility-administered medications for this visit.     No Known Allergies    Review of Systems:   General:  normal appetite, normal energy, no weight gain, no weight loss, no fever  Cardiac:  no chest pain with exertion, no chest pain at rest, +SOB with exertion, no resting SOB, no PND, no orthopnea, + palpitations, no arrhythmia, no atrial fibrillation, no LE edema, no dizzy spells, no syncope  Respiratory:  + shortness of breath, no home oxygen, occasional productive cough, chronic dry cough, no bronchitis, + wheezing, no hemoptysis, + asthma, no pain with inspiration or cough, no sleep apnea, no CPAP at night  GI:   no difficulty swallowing, +  reflux, + frequent heartburn, no hiatal hernia, no abdominal pain, no constipation, no diarrhea, no hematochezia, no hematemesis, no melena  GU:   no dysuria,  no frequency, no urinary tract infection, no hematuria, no enlarged prostate, no kidney stones, no kidney disease  Vascular:  no pain suggestive of claudication, no pain in feet, no leg cramps, no varicose veins, no DVT, no non-healing foot ulcer  Neuro:   no stroke, no TIA's, no seizures, no headaches, no temporary blindness one eye,  no slurred speech, no peripheral neuropathy, no chronic pain, no instability of gait, no memory/cognitive dysfunction  Musculoskeletal: no  arthritis, no joint swelling, no myalgias, no difficulty walking, normal mobility   Skin:   no rash, no itching, no skin infections, no pressure sores or ulcerations  Psych:   no anxiety, no depression, no nervousness, no unusual recent stress  Eyes:   no blurry vision, + floaters, no recent vision changes, does not wear glasses or contacts  ENT:   no hearing loss, no loose or painful teeth but one "cracked" tooth that is intermittently sensitive, no dentures, last saw dentist within the past year  Hematologic:  no easy bruising, no abnormal bleeding, no clotting disorder, no frequent epistaxis  Endocrine:  no diabetes, does not check CBG's at home     Physical Exam:   BP (!) 143/85 (BP Location: Left Arm, Patient Position: Sitting, Cuff Size: Normal)    Pulse (!) 58    Temp 98.1 F (36.7 C)    Resp 18    Ht  (1.905 m)    Wt 205 lb (93 kg)    SpO2 96% Comment: RA   BMI 25.62 kg/m   General:    well-appearing  HEENT:  Unremarkable   Neck:   no JVD, no bruits, no adenopathy   Chest:   clear to auscultation, symmetrical breath sounds, no wheezes, no rhonchi   CV:   RRR, grade III/VI holosystolic murmur best at apex  Abdomen:  soft, non-tender, no masses   Extremities:  warm, well-perfused, pulses palpable, no LE edema  Rectal/GU  Deferred  Neuro:   Grossly  non-focal and symmetrical throughout  Skin:   Clean and dry, no rashes, no breakdown   Diagnostic Tests:  ECHOCARDIOGRAMS  Both images and reports from transthoracic and transesophageal echocardiograms performed at [facility] in June 2020 have been reviewed.  The patient has normal left ventricular systolic function.  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  The jet of mitral regurgitation is eccentric and graded severe on transesophageal echocardiogram.  There appears to be ruptured primary chordae tendinae.  There is mild left atrial enlargement with normal left ventricular size and function.  No other significant abnormalities are noted.    RIGHT/LEFT HEART CATH AND CORONARY ANGIOGRAPHY  Conclusion   Mild to moderate mitral regurgitation by left ventriculography.  Normal pulmonary artery hemodynamics including wedge pressure without significant V wave.  Normal right dominant coronary anatomy.  Normal coronary arteries.  Normal LV function with EF at least 60%.  LVEDP is normal.  RECOMMENDATIONS:   The patient does have significantly exertional dyspnea.  Dyspnea seems out of proportion to resting hemodynamics.  Recent transesophageal echo revealed P2 prolapse with moderate to severe mitral regurgitation [facility]).  Plan to refer patient to Dr. Tressie Stalker for further evaluation.  May need pulmonary evaluation.  Recommendations  Antiplatelet/Anticoag No indication for antiplatelet therapy at this time .  Surgeon Notes    06/17/2019 11:08 AM CV Procedure addendum by Lyn Records, MD  Indications  Nonrheumatic mitral valve regurgitation [I34.0 (ICD-10-CM)]  DOE (dyspnea on exertion) [R06.09 (ICD-10-CM)]  Procedural Details  Technical Details The right radial area was sterilely prepped and  draped. Intravenous sedation with Versed and fentanyl was administered. 1% Xylocaine was infiltrated to achieve local analgesia. Using real-time vascular ultrasound, a double wall stick with an angiocath was utilized to obtain intra-arterial access. A VUS image was saved for the permanent record.The modified  Seldinger technique was used to place a 9F " Slender" sheath in the right radial artery. Weight based heparin was administered. Coronary angiography was done using 5 F catheters. Right coronary angiography was performed with a JR4. Left coronary angiography was performed with a JL 3.5 cm.  After coronary angiography, an angled 5 French pigtail was advanced across the aortic valve.  Hemodynamic recordings were performed.  Left ventriculography in the 30 degree RAO projection was performed using 18 cc of contrast for a total of 45 cc.  Mild to moderate mitral regurgitation was noted.  Ventricular ectopy did occur during injection.  An aortic arterial saturation was also performed.  Right heart catheterization was performed by exchanging a previously placed antecubital IV angio-cath for a 5 French Slender sheath. 1% Xylocaine was used to locally nesthetize the area around the IV site. The IV catheter was wired using an .018 guidewire. The modified Seldinger technique was used to place the 5 [place] sheath. Double glove technique was used to enhance sterility. After sheath insertion, right heart cath was performed using a 5 French balloon tipped catheter and fluoroscopic guidance. Pressures were recorded in each chamber and in the pulmonary capillary wedge position.. The main pulmonary artery O2 saturation was sampled.   Hemostasis was achieved using a pneumatic band.  During this procedure the patient is administered a total of Versed 3 mg and Fentanyl 75 mcg to achieve and maintain moderate conscious sedation.  The patient's heart rate, blood pressure, and oxygen saturation are monitored continuously during  the procedure. The period of conscious sedation is 45 minutes, of which I was present face-to-face 100% of this time. Estimated blood loss <50 mL.   During this procedure medications were administered to achieve and maintain moderate conscious sedation while the patient's heart rate, blood pressure, and oxygen saturation were continuously monitored and I was present face-to-face 100% of this time.  Medications (Filter: Administrations occurring from 06/17/19 1002 to 06/17/19 1106) (important)  Continuous medications are totaled by the amount administered until 06/17/19 1106.  Medication Rate/Dose/Volume Action  Date Time   fentaNYL (SUBLIMAZE) injection (mcg) 25 mcg Given 06/17/19 1016   Total dose as of 06/17/19 1106 25 mcg Given 1030   50 mcg        midazolam (VERSED) injection (mg) 1 mg Given 06/17/19 1017   Total dose as of 06/17/19 1106 1 mg Given 1029   3 mg 1 mg Given 1043   Heparin (Porcine) in NaCl 1000-0.9 UT/500ML-% SOLN (mL) 500 mL Given 06/17/19 1017   Total dose as of 06/17/19 1106 500 mL Given 1017   1,000 mL        lidocaine (PF) (XYLOCAINE) 1 % injection (mL) 2 mL Given 06/17/19 1030   Total dose as of 06/17/19 1106 2 mL Given 1031   4 mL        Radial Cocktail/Verapamil only (mL) 10 mL Given 06/17/19 1034   Total dose as of 06/17/19 1106        10 mL        heparin injection (Units) 5,000 Units Given 06/17/19 1041   Total dose as of 06/17/19 1106        5,000 Units        iohexol (OMNIPAQUE) 350 MG/ML injection (mL) 120 mL Given 06/17/19 1103   Total dose as of 06/17/19 1106        120 mL        Sedation Time  Sedation Time Physician-1: 43 minutes 15  seconds  Contrast  Medication Name Total Dose  iohexol (OMNIPAQUE) 350 MG/ML injection 120 mL    Radiation/Fluoro  Fluoro time: 5.6 (min) DAP: 15467 (mGycm2) Cumulative Air Kerma: 282 (mGy)  Coronary Findings  Diagnostic Dominance: Right Ramus Intermedius  Vessel is large.  Left Circumflex  Vessel is  small.  First Obtuse Marginal Branch  Vessel is small in size.  Second Obtuse Marginal Branch  Vessel is small in size.  Third Obtuse Marginal Branch  Vessel is small in size.  Intervention  No interventions have been documented. Right Heart  Right Heart Pressures Hemodynamic findings consistent with pulmonary hypertension and mitral valve regurgitation. LV EDP is normal. The pulmonary capillary wedge mean pressure is low normal.  No V wave is noted.  Wall Motion  Resting               Left Heart  Left Ventricle The left ventricular size is normal. The left ventricular systolic function is normal. LV end diastolic pressure is normal. The left ventricular ejection fraction is 55-65% by visual estimate. There is mild to moderate mitral regurgitation.  Coronary Diagrams  Diagnostic Dominance: Right  Intervention  Implants   No implant documentation for this case.  Syngo Images  Show images for CARDIAC CATHETERIZATION  Images on Long Term Storage  Show images for Dmonte, Maher to Procedure Log  Procedure Log    Hemo Data   Most Recent Value  Fick Cardiac Output 7.25 L/min  Fick Cardiac Output Index 3.25 (L/min)/BSA  RA A Wave 5 mmHg  RA V Wave 2 mmHg  RA Mean 1 mmHg  RV Systolic Pressure 23 mmHg  RV Diastolic Pressure 1 mmHg  RV EDP 4 mmHg  PA Systolic Pressure 20 mmHg  PA Diastolic Pressure 7 mmHg  PA Mean 12 mmHg  PW A Wave 9 mmHg  PW V Wave 8 mmHg  PW Mean 6 mmHg  AO Systolic Pressure 952 mmHg  AO Diastolic Pressure 74 mmHg  AO Mean 91 mmHg  LV Systolic Pressure 841 mmHg  LV Diastolic Pressure 2 mmHg  LV EDP 7 mmHg  AOp Systolic Pressure 324 mmHg  AOp Diastolic Pressure 65 mmHg  AOp Mean Pressure 86 mmHg  LVp Systolic Pressure 401 mmHg  LVp Diastolic Pressure 4 mmHg  LVp EDP Pressure 7 mmHg  QP/QS 1  TPVR Index 3.69 HRUI  TSVR Index 27.96 HRUI  PVR SVR Ratio 0.07  TPVR/TSVR Ratio 0.13      Impression:  Patient has mitral  valve prolapse with stage D severe symptomatic primary mitral regurgitation.  He describes a 4 to 5-year history of exertional shortness of breath that has been progressive over time, consistent with chronic diastolic congestive heart failure, New York Heart Association functional class II.  I have personally reviewed the patient's transthoracic and transesophageal echocardiograms performed June 2020.  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  There is normal left ventricular size and systolic function.  There are no other complicating features.  Diagnostic cardiac catheterization reveals normal coronary artery anatomy and normal right heart pressures.  Patient needs elective mitral valve repair.  Based upon review of the patient's history, examination, and diagnostic tests I would anticipate that elective surgical intervention would come with less than 1% risk of mortality or major complication and better than 98% likelihood of successful and durable mitral valve  repair.  The patient appears to be good candidate for minimally invasive approach for surgery.   Plan:  The patient and his wife counseled at length regarding the indications, risks and potential benefits of mitral valve repair.  The rationale for elective surgery has been explained, including a comparison between surgery and continued medical therapy with close follow-up.  The likelihood of successful and durable mitral valve repair has been discussed with particular reference to the findings of their recent echocardiogram.  Based upon these findings and previous experience, I have quoted them a greater than 98 percent likelihood of successful valve repair with less than 1 percent risk of mortality or major morbidity.  Alternative surgical approaches have been discussed including a comparison  between conventional sternotomy and minimally-invasive techniques.  The relative risks and benefits of each have been reviewed as they pertain to the patient's specific circumstances, and expectations for the patient's postoperative convalescence has been discussed.  Patient desires to proceed with surgery as soon as practical.  As a neck step he will undergo CT angiography of the aorta and iliac vessels to evaluate the feasibility of peripheral cannulation for surgery.  We tentatively plan to proceed with mitral valve repair on June 23, 2019.  The patient understands and accepts all potential risks of surgery including but not limited to risk of death, stroke or other neurologic complication, myocardial infarction, congestive heart failure, respiratory failure, renal failure, bleeding requiring transfusion and/or reexploration, arrhythmia, infection or other wound complications, pneumonia, pleural and/or pericardial effusion, pulmonary embolus, aortic dissection or other major vascular complication, or delayed complications related to valve repair or replacement including but not limited to structural valve deterioration and failure, thrombosis, embolization, endocarditis, or paravalvular leak.  Specific risks potentially related to the minimally-invasive approach were discussed at length, including but not limited to risk of conversion to full or partial sternotomy, aortic dissection or other major vascular complication, unilateral acute lung injury or pulmonary edema, phrenic nerve dysfunction or paralysis, rib fracture, chronic pain, lung hernia, or lymphocele. All of their questions have been answered.    I spent in excess of 90 minutes during the conduct of this office consultation and >50% of this time involved direct face-to-face encounter with the patient for counseling and/or coordination of their care.    Salvatore Decentlarence H. Cornelius Moraswen, MD 06/18/2019 2:32 PM

## 2019-06-18 NOTE — Progress Notes (Signed)
[place], [place] [address] Phone: [phone] Fax: [phone]      Your procedure is scheduled on Tuesday October 6th.  Report to [facility] Main Entrance "A" at 0530 A.M., and check in at the Admitting office.  Call this number if you have problems the morning of surgery:  [phone]  Call [phone] if you have any questions prior to your surgery date Monday-Friday 8am-4pm    Remember:  Do not eat or drink after midnight the night before your surgery     Take these medicines the morning of surgery with A SIP OF WATER   pantoprazole (PROTONIX) omeprazole (PRILOSEC) budesonide (RHINOCORT AQUA) nasal spray  Guaifenesin (MUCINEX MAXIMUM STRENGTH)- if needed budesonide-formoterol (SYMBICORT)  Inhaler- if needed   7 days prior to surgery STOP taking any Aspirin (unless otherwise instructed by your surgeon), Aleve, Naproxen, Ibuprofen, Motrin, Advil, Goody's, BC's, all herbal medications, fish oil, and all vitamins.    The Morning of Surgery  Do not wear jewelry.  Do not wear lotions, powders,colognes, or deodorant  Men may shave face and neck.  Do not bring valuables to the hospital.  [facility] is not responsible for any belongings or valuables.  If you are a smoker, DO NOT Smoke 24 hours prior to surgery IF you wear a CPAP at night please bring your mask, tubing, and machine the morning of surgery   Remember that you must have someone to transport you home after your surgery, and remain with you for 24 hours if you are discharged the same day.   Contacts, glasses, hearing aids, dentures or bridgework may not be worn into surgery.    Leave your suitcase in the car.  After surgery it may be brought to your room.  For patients admitted to the hospital, discharge time will be determined by your treatment team.  Patients discharged the day of surgery will not be allowed to  drive home.    Special instructions:   [place]- Preparing For Surgery  Before surgery, you can play an important role. Because skin is not sterile, your skin needs to be as free of germs as possible. You can reduce the number of germs on your skin by washing with CHG (chlorahexidine gluconate) Soap before surgery.  CHG is an antiseptic cleaner which kills germs and bonds with the skin to continue killing germs even after washing.    Oral Hygiene is also important to reduce your risk of infection.  Remember - BRUSH YOUR TEETH THE MORNING OF SURGERY WITH YOUR REGULAR TOOTHPASTE  Please do not use if you have an allergy to CHG or antibacterial soaps. If your skin becomes reddened/irritated stop using the CHG.  Do not shave (including legs and underarms) for at least 48 hours prior to first CHG shower. It is OK to shave your face.  Please follow these instructions carefully.   1. Shower the NIGHT BEFORE SURGERY and the MORNING OF SURGERY with CHG Soap.   2. If you chose to wash your hair, wash your hair first as usual with your normal shampoo.  3. After you shampoo, rinse your hair and body thoroughly to remove the shampoo.  4. Use CHG as you would any other liquid soap. You can apply CHG directly to the skin and wash gently with a scrungie or a clean washcloth.   5. Apply the CHG Soap to your body ONLY FROM THE NECK DOWN.  Do not use on open wounds or open sores. Avoid contact with your eyes, ears, mouth and genitals (private parts). Wash Face and genitals (private parts)  with your normal soap.   6. Wash thoroughly, paying special attention to the area where your surgery will be performed.  7. Thoroughly rinse your body with warm water from the neck down.  8. DO NOT shower/wash with your normal soap after using and rinsing off the CHG Soap.  9. Pat yourself dry with a CLEAN TOWEL.  10. Wear CLEAN PAJAMAS to bed the night before surgery, wear comfortable clothes the morning of  surgery  11. Place CLEAN SHEETS on your bed the night of your first shower and DO NOT SLEEP WITH PETS.    Day of Surgery:  Do not apply any deodorants/lotions. Please shower the morning of surgery with the CHG soap  Please wear clean clothes to the hospital/surgery center.   Remember to brush your teeth WITH YOUR REGULAR TOOTHPASTE.   Please read over the following fact sheets that you were given.

## 2019-06-19 ENCOUNTER — Ambulatory Visit (HOSPITAL_BASED_OUTPATIENT_CLINIC_OR_DEPARTMENT_OTHER)
Admission: RE | Admit: 2019-06-19 | Discharge: 2019-06-19 | Disposition: A | Discharge status: Home / Self Care | Payer: PRIVATE HEALTH INSURANCE | Source: Ambulatory Visit | Attending: Thoracic Surgery (Cardiothoracic Vascular Surgery) | Admitting: Thoracic Surgery (Cardiothoracic Vascular Surgery)

## 2019-06-19 ENCOUNTER — Ambulatory Visit (HOSPITAL_COMMUNITY)
Admission: RE | Admit: 2019-06-19 | Discharge: 2019-06-19 | Disposition: A | Discharge status: Home / Self Care | Payer: PRIVATE HEALTH INSURANCE | Source: Ambulatory Visit | Attending: Thoracic Surgery (Cardiothoracic Vascular Surgery) | Admitting: Thoracic Surgery (Cardiothoracic Vascular Surgery)

## 2019-06-19 ENCOUNTER — Encounter (HOSPITAL_COMMUNITY): Payer: Self-pay

## 2019-06-19 ENCOUNTER — Other Ambulatory Visit (HOSPITAL_COMMUNITY)
Admission: RE | Admit: 2019-06-19 | Discharge: 2019-06-19 | Disposition: A | Discharge status: Home / Self Care | Payer: PRIVATE HEALTH INSURANCE | Source: Ambulatory Visit | Attending: Orthopedic Surgery | Admitting: Orthopedic Surgery

## 2019-06-19 ENCOUNTER — Encounter (HOSPITAL_COMMUNITY)
Admission: RE | Admit: 2019-06-19 | Discharge: 2019-06-19 | Disposition: A | Discharge status: Home / Self Care | Payer: PRIVATE HEALTH INSURANCE | Source: Ambulatory Visit | Attending: Thoracic Surgery (Cardiothoracic Vascular Surgery) | Admitting: Thoracic Surgery (Cardiothoracic Vascular Surgery)

## 2019-06-19 DIAGNOSIS — K449 Diaphragmatic hernia without obstruction or gangrene: Secondary | ICD-10-CM | POA: Insufficient documentation

## 2019-06-19 DIAGNOSIS — I700 Atherosclerosis of aorta: Secondary | ICD-10-CM | POA: Insufficient documentation

## 2019-06-19 DIAGNOSIS — Z20828 Contact with and (suspected) exposure to other viral communicable diseases: Secondary | ICD-10-CM | POA: Insufficient documentation

## 2019-06-19 DIAGNOSIS — K5730 Diverticulosis of large intestine without perforation or abscess without bleeding: Secondary | ICD-10-CM | POA: Insufficient documentation

## 2019-06-19 DIAGNOSIS — K4020 Bilateral inguinal hernia, without obstruction or gangrene, not specified as recurrent: Secondary | ICD-10-CM | POA: Insufficient documentation

## 2019-06-19 DIAGNOSIS — I774 Celiac artery compression syndrome: Secondary | ICD-10-CM | POA: Diagnosis not present

## 2019-06-19 DIAGNOSIS — Z01812 Encounter for preprocedural laboratory examination: Secondary | ICD-10-CM | POA: Diagnosis not present

## 2019-06-19 DIAGNOSIS — R918 Other nonspecific abnormal finding of lung field: Secondary | ICD-10-CM | POA: Diagnosis not present

## 2019-06-19 DIAGNOSIS — I340 Nonrheumatic mitral (valve) insufficiency: Secondary | ICD-10-CM

## 2019-06-19 DIAGNOSIS — Z01810 Encounter for preprocedural cardiovascular examination: Secondary | ICD-10-CM | POA: Diagnosis not present

## 2019-06-19 DIAGNOSIS — K551 Chronic vascular disorders of intestine: Secondary | ICD-10-CM | POA: Diagnosis not present

## 2019-06-19 DIAGNOSIS — I771 Stricture of artery: Secondary | ICD-10-CM | POA: Insufficient documentation

## 2019-06-19 HISTORY — DX: Inflammatory liver disease, unspecified: K75.9

## 2019-06-19 HISTORY — DX: Gastro-esophageal reflux disease without esophagitis: K21.9

## 2019-06-19 LAB — SARS CORONAVIRUS 2 BY RT PCR (HOSPITAL ORDER, PERFORMED IN ~~LOC~~ HOSPITAL LAB): SARS Coronavirus 2: NEGATIVE

## 2019-06-19 LAB — BLOOD GAS, ARTERIAL
Acid-Base Excess: 0.3 mmol/L (ref 0.0–2.0)
Bicarbonate: 24.1 mmol/L (ref 20.0–28.0)
O2 Saturation: 94.9 %
Patient temperature: 98.6
pCO2 arterial: 37 mmHg (ref 32.0–48.0)
pH, Arterial: 7.43 (ref 7.350–7.450)
pO2, Arterial: 76.5 mmHg — ABNORMAL LOW (ref 83.0–108.0)

## 2019-06-19 LAB — URINALYSIS, ROUTINE W REFLEX MICROSCOPIC
Bilirubin Urine: NEGATIVE
Glucose, UA: NEGATIVE mg/dL
Hgb urine dipstick: NEGATIVE
Ketones, ur: NEGATIVE mg/dL
Leukocytes,Ua: NEGATIVE
Nitrite: NEGATIVE
Protein, ur: NEGATIVE mg/dL
Specific Gravity, Urine: 1.043 — ABNORMAL HIGH (ref 1.005–1.030)
pH: 7 (ref 5.0–8.0)

## 2019-06-19 LAB — CBC
HCT: 51.3 % (ref 39.0–52.0)
Hemoglobin: 16.4 g/dL (ref 13.0–17.0)
MCH: 28 pg (ref 26.0–34.0)
MCHC: 32 g/dL (ref 30.0–36.0)
MCV: 87.7 fL (ref 80.0–100.0)
Platelets: 252 10*3/uL (ref 150–400)
RBC: 5.85 MIL/uL — ABNORMAL HIGH (ref 4.22–5.81)
RDW: 13.5 % (ref 11.5–15.5)
WBC: 9 10*3/uL (ref 4.0–10.5)
nRBC: 0 % (ref 0.0–0.2)

## 2019-06-19 LAB — COMPREHENSIVE METABOLIC PANEL
ALT: 13 U/L (ref 0–44)
AST: 16 U/L (ref 15–41)
Albumin: 4.1 g/dL (ref 3.5–5.0)
Alkaline Phosphatase: 65 U/L (ref 38–126)
Anion gap: 12 (ref 5–15)
BUN: 11 mg/dL (ref 6–20)
CO2: 20 mmol/L — ABNORMAL LOW (ref 22–32)
Calcium: 9.1 mg/dL (ref 8.9–10.3)
Chloride: 106 mmol/L (ref 98–111)
Creatinine, Ser: 0.86 mg/dL (ref 0.61–1.24)
GFR calc Af Amer: >60 mL/min (ref >60)
GFR calc non Af Amer: >60 mL/min (ref >60)
Glucose, Bld: 98 mg/dL (ref 70–99)
Potassium: 4.2 mmol/L (ref 3.5–5.1)
Sodium: 138 mmol/L (ref 135–145)
Total Bilirubin: 1.3 mg/dL — ABNORMAL HIGH (ref 0.3–1.2)
Total Protein: 7.5 g/dL (ref 6.5–8.1)

## 2019-06-19 LAB — SURGICAL PCR SCREEN
MRSA, PCR: NEGATIVE
Staphylococcus aureus: POSITIVE — AB

## 2019-06-19 LAB — APTT: aPTT: 37 seconds — ABNORMAL HIGH (ref 24–36)

## 2019-06-19 LAB — PROTIME-INR
INR: 1 (ref 0.8–1.2)
Prothrombin Time: 13.5 seconds (ref 11.4–15.2)

## 2019-06-19 LAB — ABO/RH: ABO/RH(D): A POS

## 2019-06-19 LAB — TYPE AND SCREEN
ABO/RH(D): A POS
Antibody Screen: NEGATIVE

## 2019-06-19 LAB — HEMOGLOBIN A1C
Hgb A1c MFr Bld: 5.5 % (ref 4.8–5.6)
Mean Plasma Glucose: 111.15 mg/dL

## 2019-06-19 MED ORDER — IOHEXOL 350 MG/ML SOLN
100.0000 mL | Freq: Once | INTRAVENOUS | Status: AC | PRN
  Administered 2019-06-19: 100 mL via INTRAVENOUS

## 2019-06-19 NOTE — Progress Notes (Signed)
PCP - [facility] Cardiologist - Dr. Daneen Schick  PPM/ICD - N/A Device Orders -N/A  Rep Notified-N/A  Chest x-ray - 06/19/19 EKG - 06/15/19 Stress Test - denies ECHO - 02/2019 Cardiac Cath - 06/17/19  Sleep Study - denies CPAP - denies  Blood Thinner Instructions: N/A Aspirin Instructions:N/A  ERAS Protcol -N/A PRE-SURGERY Ensure -N/A   COVID TEST- 06/19/19; result pending. Pt aware to quarantine.    Anesthesia review: Yes  Patient denies shortness of breath, fever, cough and chest pain at PAT appointment   Patient verbalized understanding of instructions that were given to them at the PAT appointment. Patient was also instructed that they will need to review over the PAT instructions again at home before surgery.   Coronavirus Screening  Have you experienced the following symptoms:  Cough yes/no: No Fever (>100.66F)  yes/no: No Runny nose yes/no: No Sore throat yes/no: No Difficulty breathing/shortness of breath  yes/no: No  Have you or a family member traveled in the last 14 days and where? yes/no: No   If the patient indicates "YES" to the above questions, their PAT will be rescheduled to limit the exposure to others and, the surgeon will be notified. THE PATIENT WILL NEED TO BE ASYMPTOMATIC FOR 14 DAYS.   If the patient is not experiencing any of these symptoms, the PAT nurse will instruct them to NOT bring anyone with them to their appointment since they may have these symptoms or traveled as well.   Please remind your patients and families that hospital visitation restrictions are in effect and the importance of the restrictions.

## 2019-06-19 NOTE — Progress Notes (Signed)
Pre op vascular       has been completed. Preliminary results can be found under CV proc through chart review. Elajah Kunsman, BS, RDMS, RVT   

## 2019-06-19 NOTE — Progress Notes (Signed)
Mupirocin ointment called into [facility] on [address] In [place]. Pt's wife notified of the prescription and will have pt start ASAP.   Jacqlyn Larsen, RN

## 2019-06-23 ENCOUNTER — Encounter (HOSPITAL_COMMUNITY): Payer: Self-pay | Admitting: Certified Registered Nurse Anesthetist

## 2019-06-23 ENCOUNTER — Encounter

## 2019-06-23 ENCOUNTER — Ambulatory Visit: Payer: PRIVATE HEALTH INSURANCE | Admitting: Interventional Cardiology

## 2019-06-23 MED ORDER — SODIUM CHLORIDE 0.9 % IV SOLN
750.0000 mg | INTRAVENOUS | Status: AC
  Administered 2019-06-24: 13:00:00 750 mg via INTRAVENOUS
  Filled 2019-06-23: qty 750

## 2019-06-23 MED ORDER — DOPAMINE-DEXTROSE 3.2-5 MG/ML-% IV SOLN
0.0000 ug/kg/min | INTRAVENOUS | Status: DC
  Filled 2019-06-23: qty 250

## 2019-06-23 MED ORDER — DEXMEDETOMIDINE HCL IN NACL 400 MCG/100ML IV SOLN
0.1000 ug/kg/h | INTRAVENOUS | Status: AC
  Administered 2019-06-24: 09:00:00 .7 ug/kg/h via INTRAVENOUS
  Filled 2019-06-23: qty 100

## 2019-06-23 MED ORDER — TRANEXAMIC ACID (OHS) BOLUS VIA INFUSION
15.0000 mg/kg | INTRAVENOUS | Status: AC
  Administered 2019-06-24: 09:00:00 1416 mg via INTRAVENOUS
  Filled 2019-06-23: qty 1416

## 2019-06-23 MED ORDER — GLUTARALDEHYDE 0.625% SOAKING SOLUTION
TOPICAL | Status: DC
  Filled 2019-06-23: qty 50

## 2019-06-23 MED ORDER — TRANEXAMIC ACID (OHS) PUMP PRIME SOLUTION
2.0000 mg/kg | INTRAVENOUS | Status: DC
  Filled 2019-06-23: qty 1.89

## 2019-06-23 MED ORDER — POTASSIUM CHLORIDE 2 MEQ/ML IV SOLN
80.0000 meq | INTRAVENOUS | Status: DC
  Filled 2019-06-23: qty 40

## 2019-06-23 MED ORDER — MANNITOL 20 % IV SOLN
INTRAVENOUS | Status: DC
  Filled 2019-06-23: qty 13

## 2019-06-23 MED ORDER — NITROGLYCERIN IN D5W 200-5 MCG/ML-% IV SOLN
2.0000 ug/min | INTRAVENOUS | Status: DC
  Filled 2019-06-23: qty 250

## 2019-06-23 MED ORDER — SODIUM CHLORIDE 0.9 % IV SOLN
1.5000 g | INTRAVENOUS | Status: AC
  Administered 2019-06-24: 09:00:00 1.5 g via INTRAVENOUS
  Filled 2019-06-23: qty 1.5

## 2019-06-23 MED ORDER — MILRINONE LACTATE IN DEXTROSE 20-5 MG/100ML-% IV SOLN
0.3000 ug/kg/min | INTRAVENOUS | Status: DC
  Filled 2019-06-23: qty 100

## 2019-06-23 MED ORDER — INSULIN REGULAR(HUMAN) IN NACL 100-0.9 UT/100ML-% IV SOLN
INTRAVENOUS | Status: AC
  Administered 2019-06-24: 09:00:00 1 [IU]/h via INTRAVENOUS
  Filled 2019-06-23: qty 100

## 2019-06-23 MED ORDER — PLASMA-LYTE 148 IV SOLN
INTRAVENOUS | Status: DC
  Filled 2019-06-23: qty 2.5

## 2019-06-23 MED ORDER — EPINEPHRINE HCL 5 MG/250ML IV SOLN IN NS
0.0000 ug/min | INTRAVENOUS | Status: DC
  Filled 2019-06-23: qty 250

## 2019-06-23 MED ORDER — VANCOMYCIN HCL 10 G IV SOLR
1500.0000 mg | INTRAVENOUS | Status: AC
  Administered 2019-06-24: 08:00:00 1500 mg via INTRAVENOUS
  Filled 2019-06-23: qty 1500

## 2019-06-23 MED ORDER — VANCOMYCIN HCL 1000 MG IV SOLR
INTRAVENOUS | Status: DC
  Filled 2019-06-23: qty 1000

## 2019-06-23 MED ORDER — SODIUM CHLORIDE 0.9 % IV SOLN
INTRAVENOUS | Status: DC
  Filled 2019-06-23: qty 30

## 2019-06-23 MED ORDER — TRANEXAMIC ACID 1000 MG/10ML IV SOLN
1.5000 mg/kg/h | INTRAVENOUS | Status: AC
  Administered 2019-06-24: 09:00:00 1.5 mg/kg/h via INTRAVENOUS
  Filled 2019-06-23: qty 25

## 2019-06-23 MED ORDER — PHENYLEPHRINE HCL-NACL 20-0.9 MG/250ML-% IV SOLN
30.0000 ug/min | INTRAVENOUS | Status: AC
  Administered 2019-06-24: 09:00:00 20 ug/min via INTRAVENOUS
  Filled 2019-06-23: qty 250

## 2019-06-24 ENCOUNTER — Inpatient Hospital Stay (HOSPITAL_COMMUNITY): Payer: PRIVATE HEALTH INSURANCE | Admitting: Certified Registered Nurse Anesthetist

## 2019-06-24 ENCOUNTER — Encounter (HOSPITAL_COMMUNITY): Payer: Self-pay

## 2019-06-24 ENCOUNTER — Inpatient Hospital Stay (HOSPITAL_COMMUNITY): Payer: PRIVATE HEALTH INSURANCE

## 2019-06-24 ENCOUNTER — Inpatient Hospital Stay (HOSPITAL_COMMUNITY): Payer: PRIVATE HEALTH INSURANCE | Admitting: Physician Assistant

## 2019-06-24 ENCOUNTER — Other Ambulatory Visit: Payer: Self-pay

## 2019-06-24 ENCOUNTER — Encounter (HOSPITAL_COMMUNITY)
Admission: RE | Disposition: A | Discharge status: Home / Self Care | Payer: Self-pay | Source: Home / Self Care | Attending: Thoracic Surgery (Cardiothoracic Vascular Surgery)

## 2019-06-24 ENCOUNTER — Inpatient Hospital Stay (HOSPITAL_COMMUNITY)
Admission: RE | Admit: 2019-06-24 | Discharge: 2019-06-29 | DRG: 220 | Disposition: A | Discharge status: Home / Self Care | Payer: PRIVATE HEALTH INSURANCE | Attending: Thoracic Surgery (Cardiothoracic Vascular Surgery) | Admitting: Thoracic Surgery (Cardiothoracic Vascular Surgery)

## 2019-06-24 DIAGNOSIS — I441 Atrioventricular block, second degree: Secondary | ICD-10-CM | POA: Diagnosis not present

## 2019-06-24 DIAGNOSIS — J45909 Unspecified asthma, uncomplicated: Secondary | ICD-10-CM | POA: Diagnosis present

## 2019-06-24 DIAGNOSIS — Z79899 Other long term (current) drug therapy: Secondary | ICD-10-CM

## 2019-06-24 DIAGNOSIS — I5032 Chronic diastolic (congestive) heart failure: Secondary | ICD-10-CM | POA: Diagnosis present

## 2019-06-24 DIAGNOSIS — Z7951 Long term (current) use of inhaled steroids: Secondary | ICD-10-CM | POA: Diagnosis not present

## 2019-06-24 DIAGNOSIS — Z09 Encounter for follow-up examination after completed treatment for conditions other than malignant neoplasm: Secondary | ICD-10-CM

## 2019-06-24 DIAGNOSIS — D62 Acute posthemorrhagic anemia: Secondary | ICD-10-CM | POA: Diagnosis not present

## 2019-06-24 DIAGNOSIS — E871 Hypo-osmolality and hyponatremia: Secondary | ICD-10-CM | POA: Diagnosis not present

## 2019-06-24 DIAGNOSIS — I341 Nonrheumatic mitral (valve) prolapse: Secondary | ICD-10-CM | POA: Diagnosis present

## 2019-06-24 DIAGNOSIS — Z86711 Personal history of pulmonary embolism: Secondary | ICD-10-CM | POA: Diagnosis not present

## 2019-06-24 DIAGNOSIS — I340 Nonrheumatic mitral (valve) insufficiency: Secondary | ICD-10-CM | POA: Diagnosis present

## 2019-06-24 DIAGNOSIS — Z8719 Personal history of other diseases of the digestive system: Secondary | ICD-10-CM | POA: Diagnosis not present

## 2019-06-24 DIAGNOSIS — T502X5A Adverse effect of carbonic-anhydrase inhibitors, benzothiadiazides and other diuretics, initial encounter: Secondary | ICD-10-CM | POA: Diagnosis not present

## 2019-06-24 DIAGNOSIS — J939 Pneumothorax, unspecified: Secondary | ICD-10-CM

## 2019-06-24 DIAGNOSIS — J9811 Atelectasis: Secondary | ICD-10-CM

## 2019-06-24 DIAGNOSIS — Z833 Family history of diabetes mellitus: Secondary | ICD-10-CM

## 2019-06-24 DIAGNOSIS — Z9889 Other specified postprocedural states: Secondary | ICD-10-CM

## 2019-06-24 HISTORY — DX: Other specified postprocedural states: Z98.890

## 2019-06-24 HISTORY — PX: MITRAL VALVE REPAIR: SHX2039

## 2019-06-24 HISTORY — DX: Nonrheumatic mitral (valve) insufficiency: I34.0

## 2019-06-24 HISTORY — PX: TEE WITHOUT CARDIOVERSION: SHX5443

## 2019-06-24 LAB — POCT I-STAT 4, (NA,K, GLUC, HGB,HCT)
Glucose, Bld: 100 mg/dL — ABNORMAL HIGH (ref 70–99)
Glucose, Bld: 116 mg/dL — ABNORMAL HIGH (ref 70–99)
Glucose, Bld: 93 mg/dL (ref 70–99)
Glucose, Bld: 157 mg/dL — ABNORMAL HIGH (ref 70–99)
Glucose, Bld: 132 mg/dL — ABNORMAL HIGH (ref 70–99)
Glucose, Bld: 158 mg/dL — ABNORMAL HIGH (ref 70–99)
HCT: 43 % (ref 39.0–52.0)
HCT: 38 % — ABNORMAL LOW (ref 39.0–52.0)
HCT: 41 % (ref 39.0–52.0)
HCT: 33 % — ABNORMAL LOW (ref 39.0–52.0)
HCT: 33 % — ABNORMAL LOW (ref 39.0–52.0)
HCT: 35 % — ABNORMAL LOW (ref 39.0–52.0)
Hemoglobin: 14.6 g/dL (ref 13.0–17.0)
Hemoglobin: 12.9 g/dL — ABNORMAL LOW (ref 13.0–17.0)
Hemoglobin: 13.9 g/dL (ref 13.0–17.0)
Hemoglobin: 11.2 g/dL — ABNORMAL LOW (ref 13.0–17.0)
Hemoglobin: 11.2 g/dL — ABNORMAL LOW (ref 13.0–17.0)
Hemoglobin: 11.9 g/dL — ABNORMAL LOW (ref 13.0–17.0)
Potassium: 3.9 mmol/L (ref 3.5–5.1)
Potassium: 4.4 mmol/L (ref 3.5–5.1)
Potassium: 4.5 mmol/L (ref 3.5–5.1)
Potassium: 5.1 mmol/L (ref 3.5–5.1)
Potassium: 4.6 mmol/L (ref 3.5–5.1)
Potassium: 5.8 mmol/L — ABNORMAL HIGH (ref 3.5–5.1)
Sodium: 140 mmol/L (ref 135–145)
Sodium: 140 mmol/L (ref 135–145)
Sodium: 142 mmol/L (ref 135–145)
Sodium: 137 mmol/L (ref 135–145)
Sodium: 138 mmol/L (ref 135–145)
Sodium: 142 mmol/L (ref 135–145)

## 2019-06-24 LAB — GLUCOSE, CAPILLARY
Glucose-Capillary: 98 mg/dL (ref 70–99)
Glucose-Capillary: 107 mg/dL — ABNORMAL HIGH (ref 70–99)
Glucose-Capillary: 124 mg/dL — ABNORMAL HIGH (ref 70–99)
Glucose-Capillary: 131 mg/dL — ABNORMAL HIGH (ref 70–99)
Glucose-Capillary: 126 mg/dL — ABNORMAL HIGH (ref 70–99)
Glucose-Capillary: 136 mg/dL — ABNORMAL HIGH (ref 70–99)
Glucose-Capillary: 139 mg/dL — ABNORMAL HIGH (ref 70–99)
Glucose-Capillary: 176 mg/dL — ABNORMAL HIGH (ref 70–99)
Glucose-Capillary: 165 mg/dL — ABNORMAL HIGH (ref 70–99)
Glucose-Capillary: 98 mg/dL (ref 70–99)

## 2019-06-24 LAB — POCT I-STAT, CHEM 8
BUN: 9 mg/dL (ref 6–20)
Calcium, Ion: 1.07 mmol/L — ABNORMAL LOW (ref 1.15–1.40)
Chloride: 106 mmol/L (ref 98–111)
Creatinine, Ser: 0.6 mg/dL — ABNORMAL LOW (ref 0.61–1.24)
Glucose, Bld: 152 mg/dL — ABNORMAL HIGH (ref 70–99)
HCT: 34 % — ABNORMAL LOW (ref 39.0–52.0)
Hemoglobin: 11.6 g/dL — ABNORMAL LOW (ref 13.0–17.0)
Potassium: 3.7 mmol/L (ref 3.5–5.1)
Sodium: 140 mmol/L (ref 135–145)
TCO2: 22 mmol/L (ref 22–32)

## 2019-06-24 LAB — CBC
HCT: 35.4 % — ABNORMAL LOW (ref 39.0–52.0)
HCT: 35.7 % — ABNORMAL LOW (ref 39.0–52.0)
Hemoglobin: 11.9 g/dL — ABNORMAL LOW (ref 13.0–17.0)
Hemoglobin: 11.7 g/dL — ABNORMAL LOW (ref 13.0–17.0)
MCH: 29.2 pg (ref 26.0–34.0)
MCH: 28.6 pg (ref 26.0–34.0)
MCHC: 33.6 g/dL (ref 30.0–36.0)
MCHC: 32.8 g/dL (ref 30.0–36.0)
MCV: 87 fL (ref 80.0–100.0)
MCV: 87.3 fL (ref 80.0–100.0)
Platelets: 165 10*3/uL (ref 150–400)
Platelets: 199 10*3/uL (ref 150–400)
RBC: 4.07 MIL/uL — ABNORMAL LOW (ref 4.22–5.81)
RBC: 4.09 MIL/uL — ABNORMAL LOW (ref 4.22–5.81)
RDW: 13.3 % (ref 11.5–15.5)
RDW: 13.4 % (ref 11.5–15.5)
WBC: 13.1 10*3/uL — ABNORMAL HIGH (ref 4.0–10.5)
WBC: 14.5 10*3/uL — ABNORMAL HIGH (ref 4.0–10.5)
nRBC: 0 % (ref 0.0–0.2)
nRBC: 0 % (ref 0.0–0.2)

## 2019-06-24 LAB — POCT I-STAT 7, (LYTES, BLD GAS, ICA,H+H)
Acid-base deficit: 2 mmol/L (ref 0.0–2.0)
Acid-base deficit: 5 mmol/L — ABNORMAL HIGH (ref 0.0–2.0)
Acid-base deficit: 4 mmol/L — ABNORMAL HIGH (ref 0.0–2.0)
Bicarbonate: 24.2 mmol/L (ref 20.0–28.0)
Bicarbonate: 23 mmol/L (ref 20.0–28.0)
Bicarbonate: 23.8 mmol/L (ref 20.0–28.0)
Calcium, Ion: 0.9 mmol/L — ABNORMAL LOW (ref 1.15–1.40)
Calcium, Ion: 1.09 mmol/L — ABNORMAL LOW (ref 1.15–1.40)
Calcium, Ion: 1.17 mmol/L (ref 1.15–1.40)
HCT: 34 % — ABNORMAL LOW (ref 39.0–52.0)
HCT: 34 % — ABNORMAL LOW (ref 39.0–52.0)
HCT: 36 % — ABNORMAL LOW (ref 39.0–52.0)
Hemoglobin: 11.6 g/dL — ABNORMAL LOW (ref 13.0–17.0)
Hemoglobin: 11.6 g/dL — ABNORMAL LOW (ref 13.0–17.0)
Hemoglobin: 12.2 g/dL — ABNORMAL LOW (ref 13.0–17.0)
O2 Saturation: 100 %
O2 Saturation: 91 %
O2 Saturation: 99 %
Patient temperature: 35.4
Potassium: 4.5 mmol/L (ref 3.5–5.1)
Potassium: 4.7 mmol/L (ref 3.5–5.1)
Potassium: 4.3 mmol/L (ref 3.5–5.1)
Sodium: 142 mmol/L (ref 135–145)
Sodium: 141 mmol/L (ref 135–145)
Sodium: 141 mmol/L (ref 135–145)
TCO2: 26 mmol/L (ref 22–32)
TCO2: 25 mmol/L (ref 22–32)
TCO2: 25 mmol/L (ref 22–32)
pCO2 arterial: 47.1 mmHg (ref 32.0–48.0)
pCO2 arterial: 53.1 mmHg — ABNORMAL HIGH (ref 32.0–48.0)
pCO2 arterial: 48.1 mmHg — ABNORMAL HIGH (ref 32.0–48.0)
pH, Arterial: 7.319 — ABNORMAL LOW (ref 7.350–7.450)
pH, Arterial: 7.245 — ABNORMAL LOW (ref 7.350–7.450)
pH, Arterial: 7.293 — ABNORMAL LOW (ref 7.350–7.450)
pO2, Arterial: 363 mmHg — ABNORMAL HIGH (ref 83.0–108.0)
pO2, Arterial: 73 mmHg — ABNORMAL LOW (ref 83.0–108.0)
pO2, Arterial: 134 mmHg — ABNORMAL HIGH (ref 83.0–108.0)

## 2019-06-24 LAB — PLATELET COUNT: Platelets: 252 10*3/uL (ref 150–400)

## 2019-06-24 LAB — CREATININE, SERUM
Creatinine, Ser: 0.78 mg/dL (ref 0.61–1.24)
GFR calc Af Amer: >60 mL/min (ref >60)
GFR calc non Af Amer: >60 mL/min (ref >60)

## 2019-06-24 LAB — PROTIME-INR
INR: 1.3 — ABNORMAL HIGH (ref 0.8–1.2)
Prothrombin Time: 15.6 seconds — ABNORMAL HIGH (ref 11.4–15.2)

## 2019-06-24 LAB — ECHO INTRAOPERATIVE TEE
Height: 75 in
Weight: 3315.72 oz

## 2019-06-24 LAB — HEMOGLOBIN AND HEMATOCRIT, BLOOD
HCT: 34.2 % — ABNORMAL LOW (ref 39.0–52.0)
Hemoglobin: 11.6 g/dL — ABNORMAL LOW (ref 13.0–17.0)

## 2019-06-24 LAB — MAGNESIUM: Magnesium: 2.5 mg/dL — ABNORMAL HIGH (ref 1.7–2.4)

## 2019-06-24 LAB — APTT: aPTT: 64 seconds — ABNORMAL HIGH (ref 24–36)

## 2019-06-24 SURGERY — REPAIR, MITRAL VALVE, MINIMALLY INVASIVE
Anesthesia: General | Site: Chest

## 2019-06-24 MED ORDER — SUGAMMADEX SODIUM 200 MG/2ML IV SOLN
INTRAVENOUS | Status: DC | PRN
  Administered 2019-06-24: 200 mg via INTRAVENOUS

## 2019-06-24 MED ORDER — METOPROLOL TARTRATE 12.5 MG HALF TABLET: 12.5000 mg | ORAL_TABLET | Freq: Once | ORAL | Status: DC

## 2019-06-24 MED ORDER — ALBUMIN HUMAN 5 % IV SOLN
250.0000 mL | INTRAVENOUS | Status: DC | PRN
  Administered 2019-06-24: 20:00:00 12.5 g via INTRAVENOUS

## 2019-06-24 MED ORDER — POTASSIUM CHLORIDE 10 MEQ/50ML IV SOLN
10.0000 meq | INTRAVENOUS | Status: AC
  Administered 2019-06-24 (×3): 10 meq via INTRAVENOUS

## 2019-06-24 MED ORDER — ACETAMINOPHEN 160 MG/5ML PO SOLN: 650.0000 mg | Freq: Once | ORAL | Status: DC

## 2019-06-24 MED ORDER — SODIUM CHLORIDE 0.9% FLUSH
3.0000 mL | Freq: Two times a day (BID) | INTRAVENOUS | Status: DC
  Administered 2019-06-25 – 2019-06-29 (×6): 3 mL via INTRAVENOUS

## 2019-06-24 MED ORDER — MAGNESIUM SULFATE 4 GM/100ML IV SOLN
4.0000 g | Freq: Once | INTRAVENOUS | Status: AC
  Administered 2019-06-24: 15:00:00 4 g via INTRAVENOUS
  Filled 2019-06-24: qty 100

## 2019-06-24 MED ORDER — FENTANYL CITRATE (PF) 250 MCG/5ML IJ SOLN
INTRAMUSCULAR | Status: AC
  Filled 2019-06-24: qty 25

## 2019-06-24 MED ORDER — ORAL CARE MOUTH RINSE
15.0000 mL | Freq: Two times a day (BID) | OROMUCOSAL | Status: DC
  Administered 2019-06-24 – 2019-06-29 (×4): 15 mL via OROMUCOSAL

## 2019-06-24 MED ORDER — ACETAMINOPHEN 500 MG PO TABS
1000.0000 mg | ORAL_TABLET | Freq: Four times a day (QID) | ORAL | Status: DC
  Administered 2019-06-24 – 2019-06-29 (×17): 1000 mg via ORAL
  Filled 2019-06-24 (×17): qty 2

## 2019-06-24 MED ORDER — 0.9 % SODIUM CHLORIDE (POUR BTL) OPTIME
TOPICAL | Status: DC | PRN
  Administered 2019-06-24: 08:00:00 4000 mL

## 2019-06-24 MED ORDER — FENTANYL CITRATE (PF) 250 MCG/5ML IJ SOLN
INTRAMUSCULAR | Status: DC | PRN
  Administered 2019-06-24 (×3): 50 ug via INTRAVENOUS
  Administered 2019-06-24: 100 ug via INTRAVENOUS
  Administered 2019-06-24 (×7): 50 ug via INTRAVENOUS
  Administered 2019-06-24: 150 ug via INTRAVENOUS
  Administered 2019-06-24 (×2): 50 ug via INTRAVENOUS

## 2019-06-24 MED ORDER — CHLORHEXIDINE GLUCONATE 0.12 % MT SOLN
15.0000 mL | Freq: Once | OROMUCOSAL | Status: AC
  Administered 2019-06-24: 07:00:00 15 mL via OROMUCOSAL

## 2019-06-24 MED ORDER — MIDAZOLAM HCL 5 MG/5ML IJ SOLN
INTRAMUSCULAR | Status: DC | PRN
  Administered 2019-06-24 (×5): 1 mg via INTRAVENOUS

## 2019-06-24 MED ORDER — LEVALBUTEROL HCL 0.63 MG/3ML IN NEBU
0.6300 mg | INHALATION_SOLUTION | Freq: Four times a day (QID) | RESPIRATORY_TRACT | Status: DC | PRN
  Administered 2019-06-24 – 2019-06-25 (×3): 0.63 mg via RESPIRATORY_TRACT
  Filled 2019-06-24 (×2): qty 3

## 2019-06-24 MED ORDER — SODIUM CHLORIDE 0.9 % IV SOLN
INTRAVENOUS | Status: DC
  Administered 2019-06-24: 15:00:00 via INTRAVENOUS

## 2019-06-24 MED ORDER — PROPOFOL 10 MG/ML IV BOLUS
INTRAVENOUS | Status: DC | PRN
  Administered 2019-06-24: 130 mg via INTRAVENOUS

## 2019-06-24 MED ORDER — TRAMADOL HCL 50 MG PO TABS
50.0000 mg | ORAL_TABLET | ORAL | Status: DC | PRN
  Administered 2019-06-24 – 2019-06-25 (×2): 100 mg via ORAL
  Administered 2019-06-25: 50 mg via ORAL
  Administered 2019-06-26 – 2019-06-29 (×8): 100 mg via ORAL
  Filled 2019-06-24 (×5): qty 2
  Filled 2019-06-24: qty 1
  Filled 2019-06-24 (×5): qty 2

## 2019-06-24 MED ORDER — PROTAMINE SULFATE 10 MG/ML IV SOLN
INTRAVENOUS | Status: AC
  Filled 2019-06-24: qty 5

## 2019-06-24 MED ORDER — METOPROLOL TARTRATE 12.5 MG HALF TABLET
ORAL_TABLET | ORAL | Status: AC
  Filled 2019-06-24: qty 1

## 2019-06-24 MED ORDER — BISACODYL 10 MG RE SUPP: 10.0000 mg | Freq: Every day | RECTAL | Status: DC

## 2019-06-24 MED ORDER — INSULIN REGULAR BOLUS VIA INFUSION
0.0000 [IU] | Freq: Three times a day (TID) | INTRAVENOUS | Status: DC
  Filled 2019-06-24: qty 10

## 2019-06-24 MED ORDER — CHLORHEXIDINE GLUCONATE CLOTH 2 % EX PADS
6.0000 | MEDICATED_PAD | Freq: Every day | CUTANEOUS | Status: DC
  Administered 2019-06-24 – 2019-06-28 (×5): 6 via TOPICAL

## 2019-06-24 MED ORDER — LIDOCAINE 2% (20 MG/ML) 5 ML SYRINGE
INTRAMUSCULAR | Status: AC
  Filled 2019-06-24: qty 5

## 2019-06-24 MED ORDER — ONDANSETRON HCL 4 MG/2ML IJ SOLN
INTRAMUSCULAR | Status: DC | PRN
  Administered 2019-06-24: 4 mg via INTRAVENOUS

## 2019-06-24 MED ORDER — ASPIRIN 81 MG PO CHEW: 324.0000 mg | CHEWABLE_TABLET | Freq: Every day | ORAL | Status: DC

## 2019-06-24 MED ORDER — CHLORHEXIDINE GLUCONATE 0.12 % MT SOLN
15.0000 mL | OROMUCOSAL | Status: AC
  Administered 2019-06-24: 15:00:00 15 mL via OROMUCOSAL

## 2019-06-24 MED ORDER — PHENYLEPHRINE HCL-NACL 20-0.9 MG/250ML-% IV SOLN: 0.0000 ug/min | INTRAVENOUS | Status: DC

## 2019-06-24 MED ORDER — VANCOMYCIN HCL 1000 MG IV SOLR
INTRAVENOUS | Status: DC | PRN
  Administered 2019-06-24: 09:00:00 1000 mL

## 2019-06-24 MED ORDER — FAMOTIDINE IN NACL 20-0.9 MG/50ML-% IV SOLN
20.0000 mg | Freq: Two times a day (BID) | INTRAVENOUS | Status: DC
  Administered 2019-06-24: 15:00:00 20 mg via INTRAVENOUS
  Filled 2019-06-24: qty 50

## 2019-06-24 MED ORDER — MIDAZOLAM HCL 2 MG/2ML IJ SOLN: 2.0000 mg | INTRAMUSCULAR | Status: DC | PRN

## 2019-06-24 MED ORDER — LACTATED RINGERS IV SOLN: INTRAVENOUS | Status: DC

## 2019-06-24 MED ORDER — LACTATED RINGERS IV SOLN
INTRAVENOUS | Status: DC | PRN
  Administered 2019-06-24: 07:00:00 via INTRAVENOUS

## 2019-06-24 MED ORDER — NITROGLYCERIN IN D5W 200-5 MCG/ML-% IV SOLN
0.0000 ug/min | INTRAVENOUS | Status: DC
  Administered 2019-06-24: 15:00:00 10 ug/min via INTRAVENOUS
  Administered 2019-06-24: 5 ug/min via INTRAVENOUS

## 2019-06-24 MED ORDER — PROPOFOL 10 MG/ML IV BOLUS
INTRAVENOUS | Status: AC
  Filled 2019-06-24: qty 20

## 2019-06-24 MED ORDER — OXYCODONE HCL 5 MG PO TABS
5.0000 mg | ORAL_TABLET | ORAL | Status: DC | PRN
  Administered 2019-06-24 – 2019-06-25 (×2): 10 mg via ORAL
  Administered 2019-06-25 (×2): 5 mg via ORAL
  Administered 2019-06-25 – 2019-06-27 (×9): 10 mg via ORAL
  Administered 2019-06-27: 5 mg via ORAL
  Administered 2019-06-27 – 2019-06-28 (×4): 10 mg via ORAL
  Filled 2019-06-24 (×4): qty 2
  Filled 2019-06-24: qty 1
  Filled 2019-06-24 (×9): qty 2
  Filled 2019-06-24: qty 1
  Filled 2019-06-24 (×3): qty 2

## 2019-06-24 MED ORDER — PROTAMINE SULFATE 10 MG/ML IV SOLN
INTRAVENOUS | Status: DC | PRN
  Administered 2019-06-24: 70 mg via INTRAVENOUS
  Administered 2019-06-24: 40 mg via INTRAVENOUS
  Administered 2019-06-24 (×2): 70 mg via INTRAVENOUS
  Administered 2019-06-24: 20 mg via INTRAVENOUS
  Administered 2019-06-24: 60 mg via INTRAVENOUS

## 2019-06-24 MED ORDER — ACETAMINOPHEN 160 MG/5ML PO SOLN: 1000.0000 mg | Freq: Four times a day (QID) | ORAL | Status: DC

## 2019-06-24 MED ORDER — BUPIVACAINE HCL (PF) 0.5 % IJ SOLN
INTRAMUSCULAR | Status: AC
  Filled 2019-06-24: qty 10

## 2019-06-24 MED ORDER — PHENYLEPHRINE 40 MCG/ML (10ML) SYRINGE FOR IV PUSH (FOR BLOOD PRESSURE SUPPORT)
PREFILLED_SYRINGE | INTRAVENOUS | Status: AC
  Filled 2019-06-24: qty 10

## 2019-06-24 MED ORDER — ROCURONIUM BROMIDE 10 MG/ML (PF) SYRINGE
PREFILLED_SYRINGE | INTRAVENOUS | Status: AC
  Filled 2019-06-24: qty 10

## 2019-06-24 MED ORDER — SODIUM CHLORIDE 0.9% FLUSH: 3.0000 mL | INTRAVENOUS | Status: DC | PRN

## 2019-06-24 MED ORDER — METOPROLOL TARTRATE 5 MG/5ML IV SOLN: 2.5000 mg | INTRAVENOUS | Status: DC | PRN

## 2019-06-24 MED ORDER — ROCURONIUM BROMIDE 10 MG/ML (PF) SYRINGE
PREFILLED_SYRINGE | INTRAVENOUS | Status: DC | PRN
  Administered 2019-06-24 (×4): 50 mg via INTRAVENOUS

## 2019-06-24 MED ORDER — LEVALBUTEROL HCL 0.63 MG/3ML IN NEBU
0.6300 mg | INHALATION_SOLUTION | Freq: Three times a day (TID) | RESPIRATORY_TRACT | Status: DC
  Administered 2019-06-24: 0.63 mg via RESPIRATORY_TRACT
  Filled 2019-06-24 (×2): qty 3

## 2019-06-24 MED ORDER — HEPARIN SODIUM (PORCINE) 1000 UNIT/ML IJ SOLN
INTRAMUSCULAR | Status: DC | PRN
  Administered 2019-06-24: 33000 [IU] via INTRAVENOUS

## 2019-06-24 MED ORDER — VANCOMYCIN HCL IN DEXTROSE 1-5 GM/200ML-% IV SOLN
1000.0000 mg | Freq: Once | INTRAVENOUS | Status: AC
  Administered 2019-06-25: 1000 mg via INTRAVENOUS
  Filled 2019-06-24: qty 200

## 2019-06-24 MED ORDER — MIDAZOLAM HCL (PF) 10 MG/2ML IJ SOLN
INTRAMUSCULAR | Status: AC
  Filled 2019-06-24: qty 2

## 2019-06-24 MED ORDER — PROTAMINE SULFATE 10 MG/ML IV SOLN
INTRAVENOUS | Status: AC
  Filled 2019-06-24: qty 25

## 2019-06-24 MED ORDER — ONDANSETRON HCL 4 MG/2ML IJ SOLN
INTRAMUSCULAR | Status: AC
  Filled 2019-06-24: qty 2

## 2019-06-24 MED ORDER — DEXMEDETOMIDINE HCL IN NACL 200 MCG/50ML IV SOLN
0.0000 ug/kg/h | INTRAVENOUS | Status: DC
  Filled 2019-06-24: qty 50

## 2019-06-24 MED ORDER — CHLORHEXIDINE GLUCONATE 4 % EX LIQD: 30.0000 mL | CUTANEOUS | Status: DC

## 2019-06-24 MED ORDER — DOCUSATE SODIUM 100 MG PO CAPS
200.0000 mg | ORAL_CAPSULE | Freq: Every day | ORAL | Status: DC
  Administered 2019-06-25 – 2019-06-29 (×3): 200 mg via ORAL
  Filled 2019-06-24 (×3): qty 2

## 2019-06-24 MED ORDER — LACTATED RINGERS IV SOLN
INTRAVENOUS | Status: DC | PRN
  Administered 2019-06-24: 08:00:00 via INTRAVENOUS

## 2019-06-24 MED ORDER — HEPARIN SODIUM (PORCINE) 1000 UNIT/ML IJ SOLN
INTRAMUSCULAR | Status: AC
  Filled 2019-06-24: qty 1

## 2019-06-24 MED ORDER — KETOROLAC TROMETHAMINE 15 MG/ML IJ SOLN
15.0000 mg | Freq: Four times a day (QID) | INTRAMUSCULAR | Status: AC
  Administered 2019-06-24 – 2019-06-25 (×5): 15 mg via INTRAVENOUS
  Filled 2019-06-24 (×5): qty 1

## 2019-06-24 MED ORDER — ACETAMINOPHEN 650 MG RE SUPP: 650.0000 mg | Freq: Once | RECTAL | Status: DC

## 2019-06-24 MED ORDER — BUPIVACAINE 0.5 % ON-Q PUMP SINGLE CATH 400 ML
400.0000 mL | INJECTION | Status: DC
  Filled 2019-06-24: qty 400

## 2019-06-24 MED ORDER — SODIUM CHLORIDE 0.9 % IV SOLN
INTRAVENOUS | Status: DC | PRN
  Administered 2019-06-24: 13:00:00 via INTRAVENOUS

## 2019-06-24 MED ORDER — LEVALBUTEROL HCL 0.63 MG/3ML IN NEBU: 0.6300 mg | INHALATION_SOLUTION | Freq: Three times a day (TID) | RESPIRATORY_TRACT | Status: DC

## 2019-06-24 MED ORDER — SODIUM CHLORIDE 0.9 % IV SOLN
1.5000 g | Freq: Two times a day (BID) | INTRAVENOUS | Status: AC
  Administered 2019-06-25 – 2019-06-27 (×4): 1.5 g via INTRAVENOUS
  Filled 2019-06-24 (×5): qty 1.5

## 2019-06-24 MED ORDER — CHLORHEXIDINE GLUCONATE 0.12 % MT SOLN
OROMUCOSAL | Status: AC
  Administered 2019-06-24: 15 mL via OROMUCOSAL
  Filled 2019-06-24: qty 15

## 2019-06-24 MED ORDER — SODIUM CHLORIDE 0.45 % IV SOLN
INTRAVENOUS | Status: DC | PRN
  Administered 2019-06-24 – 2019-06-25 (×2): via INTRAVENOUS

## 2019-06-24 MED ORDER — ASPIRIN EC 325 MG PO TBEC: 325.0000 mg | DELAYED_RELEASE_TABLET | Freq: Every day | ORAL | Status: DC

## 2019-06-24 MED ORDER — SODIUM CHLORIDE 0.9 % IR SOLN
Status: DC | PRN
  Administered 2019-06-24: 3000 mL

## 2019-06-24 MED ORDER — LIDOCAINE 2% (20 MG/ML) 5 ML SYRINGE
INTRAMUSCULAR | Status: DC | PRN
  Administered 2019-06-24: 40 mg via INTRAVENOUS

## 2019-06-24 MED ORDER — SODIUM CHLORIDE 0.9 % IV SOLN: 250.0000 mL | INTRAVENOUS | Status: DC

## 2019-06-24 MED ORDER — BUPIVACAINE HCL (PF) 0.5 % IJ SOLN
INTRAMUSCULAR | Status: DC | PRN
  Administered 2019-06-24: 10 mL

## 2019-06-24 MED ORDER — LACTATED RINGERS IV SOLN
INTRAVENOUS | Status: DC
  Administered 2019-06-25: 05:00:00 10 mL/h via INTRAVENOUS

## 2019-06-24 MED ORDER — DEXAMETHASONE SODIUM PHOSPHATE 10 MG/ML IJ SOLN
INTRAMUSCULAR | Status: AC
  Filled 2019-06-24: qty 1

## 2019-06-24 MED ORDER — MORPHINE SULFATE (PF) 2 MG/ML IV SOLN
1.0000 mg | INTRAVENOUS | Status: DC | PRN
  Administered 2019-06-24 (×2): 4 mg via INTRAVENOUS
  Administered 2019-06-24 – 2019-06-27 (×2): 2 mg via INTRAVENOUS
  Filled 2019-06-24: qty 2
  Filled 2019-06-24 (×2): qty 1
  Filled 2019-06-24: qty 2

## 2019-06-24 MED ORDER — ONDANSETRON HCL 4 MG/2ML IJ SOLN
4.0000 mg | Freq: Four times a day (QID) | INTRAMUSCULAR | Status: DC | PRN
  Administered 2019-06-25: 4 mg via INTRAVENOUS
  Filled 2019-06-24: qty 2

## 2019-06-24 MED ORDER — POTASSIUM CHLORIDE 10 MEQ/50ML IV SOLN: 10.0000 meq | INTRAVENOUS | Status: AC

## 2019-06-24 MED ORDER — ALBUMIN HUMAN 5 % IV SOLN
INTRAVENOUS | Status: DC | PRN
  Administered 2019-06-24 (×3): via INTRAVENOUS

## 2019-06-24 MED ORDER — BUPIVACAINE 0.5 % ON-Q PUMP SINGLE CATH 400 ML
INJECTION | Status: AC | PRN
  Administered 2019-06-24: 400 mL

## 2019-06-24 MED ORDER — LACTATED RINGERS IV SOLN: 500.0000 mL | Freq: Once | INTRAVENOUS | Status: DC | PRN

## 2019-06-24 MED ORDER — PLASMA-LYTE 148 IV SOLN
INTRAVENOUS | Status: DC | PRN
  Administered 2019-06-24: 08:00:00 500 mL

## 2019-06-24 MED ORDER — INSULIN REGULAR(HUMAN) IN NACL 100-0.9 UT/100ML-% IV SOLN
INTRAVENOUS | Status: DC
  Administered 2019-06-24: 0.8 [IU]/h via INTRAVENOUS

## 2019-06-24 MED ORDER — DEXAMETHASONE SODIUM PHOSPHATE 10 MG/ML IJ SOLN
INTRAMUSCULAR | Status: DC | PRN
  Administered 2019-06-24: 10 mg via INTRAVENOUS

## 2019-06-24 MED ORDER — BISACODYL 5 MG PO TBEC
10.0000 mg | DELAYED_RELEASE_TABLET | Freq: Every day | ORAL | Status: DC
  Administered 2019-06-25 – 2019-06-29 (×4): 10 mg via ORAL
  Filled 2019-06-24 (×5): qty 2

## 2019-06-24 MED ORDER — PANTOPRAZOLE SODIUM 40 MG PO TBEC: 40.0000 mg | DELAYED_RELEASE_TABLET | Freq: Every day | ORAL | Status: DC

## 2019-06-24 SURGICAL SUPPLY — 110 items
ADAPTER CARDIO PERF ANTE/RETRO (ADAPTER) ×3 IMPLANT
BAG DECANTER FOR FLEXI CONT (MISCELLANEOUS) ×6 IMPLANT
BLADE CLIPPER SURG (BLADE) ×3 IMPLANT
BLADE STERNUM SYSTEM 6 (BLADE) ×3 IMPLANT
BLADE SURG 11 STRL SS (BLADE) ×3 IMPLANT
CANISTER SUCT 3000ML PPV (MISCELLANEOUS) ×6 IMPLANT
CANNULA FEM VENOUS REMOTE 22FR (CANNULA) ×3 IMPLANT
CANNULA FEMORAL ART 14 SM (MISCELLANEOUS) ×3 IMPLANT
CANNULA GUNDRY RCSP 15FR (MISCELLANEOUS) ×3 IMPLANT
CANNULA OPTISITE PERFUSION 16F (CANNULA) IMPLANT
CANNULA OPTISITE PERFUSION 18F (CANNULA) ×3 IMPLANT
CANNULA SUMP PERICARDIAL (CANNULA) ×6 IMPLANT
CATH CPB KIT OWEN (MISCELLANEOUS) IMPLANT
CATH KIT ON-Q SILVERSOAK 5IN (CATHETERS) ×3 IMPLANT
CELLS DAT CNTRL 66122 CELL SVR (MISCELLANEOUS) ×2 IMPLANT
CONN ST 1/4X3/8  BEN (MISCELLANEOUS) ×3
CONN ST 1/4X3/8 BEN (MISCELLANEOUS) ×6 IMPLANT
CONNECTOR 1/2X3/8X1/2 3 WAY (MISCELLANEOUS) ×1
CONNECTOR 1/2X3/8X1/2 3WAY (MISCELLANEOUS) ×2 IMPLANT
CONT SPEC 4OZ CLIKSEAL STRL BL (MISCELLANEOUS) ×3 IMPLANT
COVER BACK TABLE 24X17X13 BIG (DRAPES) ×3 IMPLANT
COVER PROBE W GEL 5X96 (DRAPES) ×3 IMPLANT
COVER WAND RF STERILE (DRAPES) IMPLANT
DERMABOND ADHESIVE PROPEN (GAUZE/BANDAGES/DRESSINGS) ×1
DERMABOND ADVANCED (GAUZE/BANDAGES/DRESSINGS) ×3
DERMABOND ADVANCED .7 DNX12 (GAUZE/BANDAGES/DRESSINGS) ×6 IMPLANT
DERMABOND ADVANCED .7 DNX6 (GAUZE/BANDAGES/DRESSINGS) ×2 IMPLANT
DEVICE CLOSURE PERCLS PRGLD 6F (VASCULAR PRODUCTS) ×10 IMPLANT
DEVICE SUT CK QUICK LOAD MINI (Prosthesis & Implant Heart) ×6 IMPLANT
DEVICE TROCAR PUNCTURE CLOSURE (ENDOMECHANICALS) ×3 IMPLANT
DRAIN CHANNEL 28F RND 3/8 FF (WOUND CARE) ×6 IMPLANT
DRAPE C-ARM 42X72 X-RAY (DRAPES) ×3 IMPLANT
DRAPE CV SPLIT W-CLR ANES SCRN (DRAPES) ×3 IMPLANT
DRAPE INCISE IOBAN 66X45 STRL (DRAPES) ×9 IMPLANT
DRAPE PERI GROIN 82X75IN TIB (DRAPES) ×3 IMPLANT
DRAPE SLUSH/WARMER DISC (DRAPES) ×3 IMPLANT
DRSG AQUACEL AG ADV 3.5X 6 (GAUZE/BANDAGES/DRESSINGS) ×3 IMPLANT
DRSG COVADERM 4X8 (GAUZE/BANDAGES/DRESSINGS) ×3 IMPLANT
ELECT BLADE 6.5 EXT (BLADE) ×3 IMPLANT
ELECT REM PT RETURN 9FT ADLT (ELECTROSURGICAL) ×6
ELECTRODE REM PT RTRN 9FT ADLT (ELECTROSURGICAL) ×4 IMPLANT
FELT TEFLON 1X6 (MISCELLANEOUS) ×3 IMPLANT
FEMORAL VENOUS CANN RAP (CANNULA) IMPLANT
GAUZE SPONGE 4X4 12PLY STRL LF (GAUZE/BANDAGES/DRESSINGS) ×3 IMPLANT
GLOVE BIO SURGEON STRL SZ 6 (GLOVE) ×6 IMPLANT
GLOVE BIO SURGEON STRL SZ 6.5 (GLOVE) ×15 IMPLANT
GLOVE BIO SURGEON STRL SZ7 (GLOVE) ×6 IMPLANT
GLOVE BIO SURGEON STRL SZ7.5 (GLOVE) ×6 IMPLANT
GLOVE BIOGEL PI IND STRL 8 (GLOVE) ×2 IMPLANT
GLOVE BIOGEL PI INDICATOR 8 (GLOVE) ×1
GLOVE ORTHO TXT STRL SZ7.5 (GLOVE) ×9 IMPLANT
GOWN STRL REUS W/ TWL LRG LVL3 (GOWN DISPOSABLE) ×16 IMPLANT
GOWN STRL REUS W/TWL LRG LVL3 (GOWN DISPOSABLE) ×8
GRASPER SUT TROCAR 14GX15 (MISCELLANEOUS) ×3 IMPLANT
KIT BASIN OR (CUSTOM PROCEDURE TRAY) ×3 IMPLANT
KIT DILATOR VASC 18G NDL (KITS) ×3 IMPLANT
KIT DRAINAGE VACCUM ASSIST (KITS) ×3 IMPLANT
KIT SUCTION CATH 14FR (SUCTIONS) ×3 IMPLANT
KIT SUT CK MINI COMBO 4X17 (Prosthesis & Implant Heart) ×3 IMPLANT
KIT TURNOVER KIT B (KITS) ×3 IMPLANT
LEAD PACING MYOCARDI (MISCELLANEOUS) ×3 IMPLANT
LINE VENT (MISCELLANEOUS) ×3 IMPLANT
NEEDLE AORTIC ROOT 14G 7F (CATHETERS) ×3 IMPLANT
NS IRRIG 1000ML POUR BTL (IV SOLUTION) ×12 IMPLANT
PACK E MIN INVASIVE VALVE (SUTURE) ×3 IMPLANT
PACK OPEN HEART (CUSTOM PROCEDURE TRAY) ×3 IMPLANT
PAD ARMBOARD 7.5X6 YLW CONV (MISCELLANEOUS) ×6 IMPLANT
PAD ELECT DEFIB RADIOL ZOLL (MISCELLANEOUS) ×3 IMPLANT
PERCLOSE PROGLIDE 6F (VASCULAR PRODUCTS) ×15
POSITIONER HEAD DONUT 9IN (MISCELLANEOUS) ×3 IMPLANT
RING MITRAL MEMO 4D 32 (Prosthesis & Implant Heart) ×3 IMPLANT
RTRCTR WOUND ALEXIS 18CM MED (MISCELLANEOUS) ×3
SET CANNULATION TOURNIQUET (MISCELLANEOUS) ×3 IMPLANT
SET CARDIOPLEGIA MPS 5001102 (MISCELLANEOUS) ×3 IMPLANT
SET IRRIG TUBING LAPAROSCOPIC (IRRIGATION / IRRIGATOR) ×3 IMPLANT
SET MICROPUNCTURE 5F STIFF (MISCELLANEOUS) ×3 IMPLANT
SHEATH PINNACLE 8F 10CM (SHEATH) ×9 IMPLANT
SOL ANTI FOG 6CC (MISCELLANEOUS) ×2 IMPLANT
SOLUTION ANTI FOG 6CC (MISCELLANEOUS) ×1
SUT BONE WAX W31G (SUTURE) ×3 IMPLANT
SUT ETHIBOND (SUTURE) ×6 IMPLANT
SUT ETHIBOND 2 0 SH (SUTURE) ×3 IMPLANT
SUT ETHIBOND 2-0 RB-1 WHT (SUTURE) ×6 IMPLANT
SUT ETHIBOND X763 2 0 SH 1 (SUTURE) ×3 IMPLANT
SUT GORETEX CV 4 TH 22 36 (SUTURE) ×3 IMPLANT
SUT GORETEX CV-5THC-13 36IN (SUTURE) ×21 IMPLANT
SUT GORETEX CV4 TH-18 (SUTURE) ×6 IMPLANT
SUT PROLENE 3 0 SH1 36 (SUTURE) ×18 IMPLANT
SUT PTFE CHORD X 20MM (SUTURE) ×3 IMPLANT
SUT SILK  1 MH (SUTURE) ×1
SUT SILK 1 MH (SUTURE) ×2 IMPLANT
SUT SILK 2 0 SH CR/8 (SUTURE) IMPLANT
SUT SILK 3 0 SH CR/8 (SUTURE) IMPLANT
SUT VIC AB 2-0 CTX 36 (SUTURE) IMPLANT
SUT VIC AB 3-0 SH 8-18 (SUTURE) IMPLANT
SUT VICRYL 2 TP 1 (SUTURE) IMPLANT
SYR 10ML LL (SYRINGE) ×3 IMPLANT
SYSTEM SAHARA CHEST DRAIN ATS (WOUND CARE) ×3 IMPLANT
TAPE CLOTH SURG 4X10 WHT LF (GAUZE/BANDAGES/DRESSINGS) ×3 IMPLANT
TAPE PAPER 2X10 WHT MICROPORE (GAUZE/BANDAGES/DRESSINGS) ×3 IMPLANT
TOWEL GREEN STERILE (TOWEL DISPOSABLE) ×3 IMPLANT
TOWEL GREEN STERILE FF (TOWEL DISPOSABLE) ×3 IMPLANT
TRAY FOLEY SLVR 16FR TEMP STAT (SET/KITS/TRAYS/PACK) ×3 IMPLANT
TROCAR XCEL BLADELESS 5X75MML (TROCAR) ×3 IMPLANT
TROCAR XCEL NON-BLD 11X100MML (ENDOMECHANICALS) ×6 IMPLANT
TUBE SUCT INTRACARD DLP 20F (MISCELLANEOUS) ×3 IMPLANT
TUNNELER SHEATH ON-Q 11GX8 DSP (PAIN MANAGEMENT) ×3 IMPLANT
UNDERPAD 30X30 (UNDERPADS AND DIAPERS) ×3 IMPLANT
WATER STERILE IRR 1000ML POUR (IV SOLUTION) ×6 IMPLANT
WIRE EMERALD 3MM-J .035X150CM (WIRE) ×3 IMPLANT

## 2019-06-24 NOTE — Transfer of Care (Signed)
Immediate Anesthesia Transfer of Care Note  Patient: Nathan [name]  Procedure(s) Performed: MINIMALLY INVASIVE MITRAL VALVE REPAIR (MVR) with a 63 Mitral Memo 4D (N/A Chest) TRANSESOPHAGEAL ECHOCARDIOGRAM (TEE) (N/A )  Patient Location: ICU  Anesthesia Type:General  Level of Consciousness: awake and alert   Airway & Oxygen Therapy: Patient Spontanous Breathing and Patient connected to face mask oxygen  Post-op Assessment: Report given to RN, Post -op Vital signs reviewed and stable and Patient moving all extremities X 4  Post vital signs: Reviewed and stable  Last Vitals:  Vitals Value Taken Time  BP    Temp    Pulse    Resp    SpO2      Last Pain:  Vitals:   06/24/19 0712  PainSc: 0-No pain         Complications: No apparent anesthesia complications

## 2019-06-24 NOTE — Anesthesia Postprocedure Evaluation (Signed)
Anesthesia Post Note  Patient: Nathan [name]  Procedure(s) Performed: MINIMALLY INVASIVE MITRAL VALVE REPAIR (MVR) with a 32 Mitral Memo 4D (N/A Chest) TRANSESOPHAGEAL ECHOCARDIOGRAM (TEE) (N/A )     Patient location during evaluation: SICU Anesthesia Type: General Level of consciousness: awake and alert and oriented Pain management: pain level controlled Vital Signs Assessment: post-procedure vital signs reviewed and stable Respiratory status: spontaneous breathing, nonlabored ventilation, respiratory function stable and patient connected to nasal cannula oxygen Cardiovascular status: blood pressure returned to baseline and stable Postop Assessment: no apparent nausea or vomiting Anesthetic complications: no    Last Vitals:  Vitals:   06/24/19 1645 06/24/19 1700  BP:  102/76  Pulse: 76 76  Resp: 11 18  Temp: (!) 36.4 C (!) 36.3 C  SpO2: 96% 94%    Last Pain:  Vitals:   06/24/19 1656  TempSrc:   PainSc: 4                  Sarya Linenberger COKER

## 2019-06-24 NOTE — Anesthesia Preprocedure Evaluation (Signed)
Anesthesia Evaluation  Patient identified by MRN, date of birth, ID band Patient awake    Reviewed: Allergy & Precautions, NPO status , Patient's Chart, lab work & pertinent test results  Airway Mallampati: II  TM Distance: >3 FB Neck ROM: Full    Dental  (+) Teeth Intact, Dental Advisory Given   Pulmonary    breath sounds clear to auscultation       Cardiovascular  Rhythm:Regular Rate:Normal + Systolic murmurs    Neuro/Psych    GI/Hepatic   Endo/Other    Renal/GU      Musculoskeletal   Abdominal   Peds  Hematology   Anesthesia Other Findings   Reproductive/Obstetrics                             Anesthesia Physical Anesthesia Plan  ASA: III  Anesthesia Plan: General   Post-op Pain Management:    Induction: Intravenous  PONV Risk Score and Plan: Ondansetron and Dexamethasone  Airway Management Planned: Double Lumen EBT  Additional Equipment: PA Cath, Arterial line, CVP, 3D TEE and Ultrasound Guidance Line Placement  Intra-op Plan:   Post-operative Plan: Extubation in OR  Informed Consent: I have reviewed the patients History and Physical, chart, labs and discussed the procedure including the risks, benefits and alternatives for the proposed anesthesia with the patient or authorized representative who has indicated his/her understanding and acceptance.     Dental advisory given  Plan Discussed with: CRNA and Anesthesiologist  Anesthesia Plan Comments:         Anesthesia Quick Evaluation

## 2019-06-24 NOTE — Op Note (Signed)
CARDIOTHORACIC SURGERY OPERATIVE NOTE  Date of Procedure:  06/24/2019  Preoperative Diagnosis: Severe Mitral Regurgitation  Postoperative Diagnosis: Same  Procedure:    Minimally-Invasive Mitral Valve Repair  Complex valvuloplasty including triangular resection of flail segment (P2) of posterior leaflet  Artificial Gore-tex neochord placement x6  Sorin Memo 4D Ring Annuloplasty (size 32mm, catalog # [id number], serial # [id number])    Surgeon: Salvatore Decent. Cornelius Moras, MD  Assistant: Rowe Clack, PA-C  Anesthesia: Kipp Brood, MD  Operative Findings:  Fibroelastic deficiency type myxomatous degenerative disease  Multiple ruptured primary chordae tendinae with flail segment (P2) of posterior leaflet  Type II dysfunction with severe mitral regurgitation  Normal left ventricular systolic function  No residual mitral regurgitation after successful valve repair                     BRIEF CLINICAL NOTE AND INDICATIONS FOR SURGERY  Patient is a 58 year old male with mitral valve prolapse who has been referred for surgical consultation to discuss treatment options for management of severe symptomatic primary mitral regurgitation.  Patient states that he first began to experience symptoms of exertional shortness of breath 4 or 5 years ago.  Symptoms developed somewhat suddenly and have persisted and gradually gotten worse.  The patient is fairly active physically but has noticed a definite decline in his tolerance with a tendency to get breathless during activity.  He has a persistent cough that is described as "hacky" and occasionally productive of thick sputum.  He reports occasional vague discomfort in his chest primarily associated with a sensation that is heart is pounding or racing.  He denies resting shortness of breath, PND, orthopnea, or lower extremity edema.  He has occasional very slight dizzy spells.  More than 6 months ago he was seen at his primary care physician's  office by a provider who noted the presence of a systolic murmur on physical exam.  The patient underwent transthoracic and transesophageal echocardiograms at [facility] which revealed mitral valve prolapse with severe mitral regurgitation and normal left ventricular systolic function.  The patient ultimately was self-referred to our practice and underwent diagnostic cardiac catheterization by Dr. Katrinka Blazing on June 17, 2019.  Catheterization revealed normal coronary artery anatomy with no significant coronary artery disease.  There was at least moderate mitral regurgitation.  Right heart pressures were normal.  Cardiothoracic surgical consultation was requested.  The patient has been seen in consultation and counseled at length regarding the indications, risks and potential benefits of surgery.  All questions have been answered, and the patient provides full informed consent for the operation as described.    DETAILS OF THE OPERATIVE PROCEDURE  Preparation:  The patient is brought to the operating room on the above mentioned date and central monitoring was established by the anesthesia team including placement of Swan-Ganz catheter through the left internal jugular vein.  A radial arterial line is placed. The patient is placed in the supine position on the operating table.  Intravenous antibiotics are administered. General endotracheal anesthesia is induced uneventfully. The patient is initially intubated using a dual lumen endotracheal tube.  A Foley catheter is placed.  Baseline transesophageal echocardiogram was performed.  Findings were notable for myxomatous degenerative disease of the mitral valve with an obvious flail segment involving a portion of the middle scallop of the posterior leaflet.  There was severe mitral regurgitation.  There is normal left ventricular size and systolic function.  No other abnormalities are noted.  A soft roll  is placed behind the patient's left  scapula and the neck gently extended and turned to the left.   The patient's right neck, chest, abdomen, both groins, and both lower extremities are prepared and draped in a sterile manner. A time out procedure is performed.   Percutaneous Vascular Access:  Percutaneous arterial and venous access were obtained on the right side.  Using ultrasound guidance the right common femoral vein was cannulated using the Seldinger technique a pair of Perclose vascular closure devises were placed at opposing 30 degree angles in the femoral vein, after which time an 8 French sheath inserted.  The right common femoral artery was cannulated using a micropuncture wire and sheath.  A pair of Perclose vascular closure devices were placed at opposing 30 degree angles in the femoral artery, and a 8 French sheath inserted.  The right internal jugular vein was cannulated  using ultrasound guidance and an 8 French sheath inserted.     Surgical Approach:  A right miniature anterolateral thoracotomy incision is performed. The incision is placed just lateral to and superior to the right nipple. The pectoralis major muscle is retracted medially and completely preserved. The right pleural space is entered through the 3rd intercostal space. A soft tissue retractor is placed.  Two 11 mm ports are placed through separate stab incisions inferiorly. The right pleural space is insufflated continuously with carbon dioxide gas through the posterior port during the remainder of the operation.  A pledgeted sutures placed through the dome of the right hemidiaphragm and retracted inferiorly to facilitate exposure.  A longitudinal incision is made in the pericardium 3 cm anterior to the phrenic nerve and silk traction sutures are placed on either side of the incision for exposure.   Extracorporeal Cardiopulmonary Bypass and Myocardial Protection:   The patient was heparinized systemically.  The right common femoral vein is cannulated through  the venous sheath and a guidewire advanced into the right atrium using TEE guidance.  The femoral vein cannulated using a 22 Fr long femoral venous cannula.  The right common femoral artery is cannulated through the arterial sheath and a guidewire advanced into the descending thoracic aorta using TEE guidance.  Femoral artery is cannulated with a 18 French femoral arterial cannula.  The right internal jugular vein is cannulated through the venous sheath and a guidewire advanced into the right atrium.  The internal jugular vein is cannulated using a 14 French pediatric femoral venous cannula.   Adequate heparinization is verified.   The entire pre-bypass portion of the operation was notable for stable hemodynamics.  Cardiopulmonary bypass was begun.  Vacuum assist venous drainage is utilized. The incision in the pericardium is extended in both directions. Venous drainage and exposure are notably excellent. A retrograde cardioplegia cannula is placed through the right atrium into the coronary sinus using transesophageal echocardiogram guidance.  An antegrade cardioplegia cannula is placed in the ascending aorta.    The patient is cooled to 32C systemic temperature.  The aortic cross clamp is applied and cardioplegia is delivered initially in an antegrade fashion through the aortic root using modified [place] cold blood cardioplegia ([place] blood cardioplegia protocol).   Supplemental cardioplegia administered retrograde through the coronary sinus catheter.  The initial cardioplegic arrest is rapid with early diastolic arrest.  Myocardial protection was felt to be excellent.   Mitral Valve Repair:  A left atriotomy incision was performed through the interatrial groove and extended partially across the back wall of the left atrium after opening the  oblique sinus inferiorly.  The mitral valve is exposed using a self-retaining retractor.  The mitral valve was inspected and notable for fibroelastic  deficiency type myxomatous degenerative disease.  There is an obvious flail segment involving the middle scallop (P2) of the posterior leaflet.  There are multiple ruptured primary chordae tendinae.  All of the ruptured cords were associated with the posterior papillary muscle.  The remainder of the valve appears essentially normal.  Interrupted 2-0 Ethibond horizontal mattress sutures are placed circumferentially around the entire mitral valve annulus. The sutures will ultimately be utilized for ring annuloplasty, and at this juncture there are utilized to suspend the valve symmetrically.  The flail segment of the middle scallop of the posterior leaflet is repaired using a combination of a simple triangular resection and artificial Gore-Tex neo-cords.  Simple triangular resection is performed and the intervening vertical defect is closed using simple interrupted everting CV 5 Gore-Tex suture.  Artificial neochord placement was performed using Chord-X multi-strand CV-4 Goretex pre-measured loops.  The appropriate cord length was measured from corresponding normal length primary cords from the P1 segment of the posterior leaflet. The papillary muscle suture of the Chord-X multi-strand suture was placed through the head of the posterior papillary muscle in a horizontal mattress fashion and tied over Teflon felt pledgets. Each of the three pre-measured loops were then reimplanted into the free margin of the P2 segment of the posterior leaflet.    The valve was tested with saline and appeared competent even without ring annuloplasty complete. The valve was sized to a 32 mm annuloplasty ring, based upon the transverse distance between the left and right commissures and the height of the anterior leaflet, corresponding to a size just slightly larger than the overall surface area of the anterior leaflet.  A Sorin Memo 4D annuloplasty ring (size 52mm, catalog #[id number], serial [id number]) was secured in place  uneventfully. All ring sutures were secured using a Cor-knot device.    The valve was tested with saline and appeared competent. There is no residual leak. There was a broad, symmetrical line of coaptation of the anterior and posterior leaflet which was confirmed using the blue ink test.  Rewarming is begun.   Procedure Completion:  The atriotomy was closed using a 2-layer closure of running 3-0 Prolene suture after placing a sump drain across the mitral valve to serve as a left ventricular vent.  One final dose of warm retrograde "reanimation dose" cardioplegia was administered retrograde through the coronary sinus catheter while all air was evacuated through the aortic root.  The aortic cross clamp was removed after a total cross clamp time of 93 minutes.  Epicardial pacing wires are fixed to the inferior wall of the right ventricule and to the right atrial appendage. The patient is rewarmed to 37C temperature. The left ventricular vent and antegrade cardioplegia cannula are removed. The patient is weaned and disconnected from cardiopulmonary bypass.  The patient's rhythm at separation from bypass was sinus.  The patient was weaned from bypass without any inotropic support. Total cardiopulmonary bypass time for the operation was 127 minutes.  Followup transesophageal echocardiogram performed after separation from bypass revealed a well-seated annuloplasty ring in the mitral position with a normal functioning mitral valve. There was no residual leak.  Left ventricular function was unchanged from preoperatively.  The mean gradient across the mitral valve was estimated to be 3 mmHg.  The femoral arterial and venous cannulas were removed and all Perclose sutures secured.  Manual pressure was maintained  while Protamine was administered.  The right internal jugular cannula was removed and manual pressure held on the neck and groin for 15 minutes.  Single lung ventilation was begun. The atriotomy closure  was inspected for hemostasis. The pericardial sac was drained using a 28 French Bard drain placed through the anterior port incision.  The right pleural space is irrigated with saline solution and inspected for hemostasis.   The On-Q pain management system is utilized for postoperative analgesia.  A single lumen catheter is passed through the subcutaneous tissues from the anterior chest wall to the posterior port incision.  The catheter was then passed through the port incision into the pleural space and tunneled into the subpleural space posteriorly to cover the second through the sixth intercostal nerve roots.  The catheter was flushed with 0.5% bupivacaine solution and ultimately connected to a continuous infusion pump.  The right pleural space was drained using a 28 French Bard drain placed through the posterior port incision. The miniature thoracotomy incision was closed in multiple layers in routine fashion. The right groin incision was inspected for hemostasis and closed in multiple layers in routine fashion.  The post-bypass portion of the operation was notable for stable rhythm and hemodynamics.  No blood products were administered during the operation.   Disposition:  The patient tolerated the procedure well.  The patient was extubated in the operating room and subsequently transported to the surgical intensive care unit in stable condition. There were no intraoperative complications. All sponge instrument and needle counts are verified correct at completion of the operation.     Salvatore Decent. Cornelius Moras MD 06/24/2019 1:43 PM

## 2019-06-24 NOTE — Progress Notes (Signed)
EVENING ROUNDS NOTE :     [facility]       [place] [address]             [phone]                 Day of Surgery Procedure(s) (LRB): MINIMALLY INVASIVE MITRAL VALVE REPAIR (MVR) with a 32 Mitral Memo 4D (N/A) TRANSESOPHAGEAL ECHOCARDIOGRAM (TEE) (N/A)   Total Length of Stay:  LOS: 0 days  Events:  Doing well extubated Good hemodynamics, on no gtts Some wheezing, nebs ordered.    BP 102/76   Pulse 76   Temp (!) 97.3 F (36.3 C)   Resp 18   Ht 6\' 3"  (1.905 m)   Wt 94 kg   SpO2 94%   BMI 25.90 kg/m   PAP: (24-32)/(14-21) 30/18 CO:  [5.4 L/min-6.4 L/min] 6.4 L/min CI:  [2.4 L/min/m2-2.9 L/min/m2] 2.9 L/min/m2     . sodium chloride 20 mL/hr at 06/24/19 1700  . sodium chloride 100 mL/hr at 06/24/19 1700  . [START ON 06/25/2019] sodium chloride    . sodium chloride 20 mL/hr at 06/24/19 1437  . albumin human    . [START ON 06/25/2019] cefUROXime (ZINACEF)  IV    . famotidine (PEPCID) IV Stopped (06/24/19 1447)  . insulin 1.4 mL/hr at 06/24/19 1700  . lactated ringers    . lactated ringers    . lactated ringers    . magnesium sulfate 20 mL/hr at 06/24/19 1700  . nitroGLYCERIN Stopped (06/24/19 1657)  . phenylephrine (NEO-SYNEPHRINE) Adult infusion    . [START ON 06/25/2019] vancomycin      No intake/output data recorded.   CBC Latest Ref Rng & Units 06/24/2019 06/24/2019 06/24/2019  WBC 4.0 - 10.5 K/uL - 13.1(H) -  Hemoglobin 13.0 - 17.0 g/dL 12.2(L) 11.9(L) 11.9(L)  Hematocrit 39.0 - 52.0 % 36.0(L) 35.4(L) 35.0(L)  Platelets 150 - 400 K/uL - 165 -    BMP Latest Ref Rng & Units 06/24/2019 06/24/2019 06/24/2019  Glucose 70 - 99 mg/dL - 132(H) -  BUN 6 - 20 mg/dL - - -  Creatinine 0.61 - 1.24 mg/dL - - -  BUN/Creat Ratio 9 - 20 - - -  Sodium 135 - 145 mmol/L 141 142 141  Potassium 3.5 - 5.1 mmol/L 4.3 4.6 4.7  Chloride 98 - 111 mmol/L - - -  CO2 22 - 32 mmol/L - - -  Calcium 8.9 - 10.3 mg/dL - - -    ABG    Component Value Date/Time   PHART 7.293 (L) 06/24/2019 1429   PCO2ART 48.1 (H) 06/24/2019 1429   PO2ART 134.0 (H) 06/24/2019 1429   HCO3 23.8 06/24/2019 1429   TCO2 25 06/24/2019 1429   ACIDBASEDEF 4.0 (H) 06/24/2019 1429   O2SAT 99.0 06/24/2019 Muir, MD 06/24/2019 5:59 PM

## 2019-06-24 NOTE — Anesthesia Procedure Notes (Addendum)
Arterial Line Insertion Start/End10/03/2019 7:45 AM, 06/24/2019 8:00 AM Performed by: CRNA  Patient location: Pre-op. Preanesthetic checklist: patient identified, IV checked, site marked, risks and benefits discussed, surgical consent, monitors and equipment checked, pre-op evaluation, timeout performed and anesthesia consent Lidocaine 1% used for infiltration Left, radial was placed Catheter size: 20 Fr Hand hygiene performed  and maximum sterile barriers used   Attempts: 1 Procedure performed without using ultrasound guided technique. Following insertion, dressing applied. Post procedure assessment: normal and unchanged  Additional procedure comments: Performed by Reece Agar, SRNA.

## 2019-06-24 NOTE — Interval H&P Note (Signed)
History and Physical Interval Note:  06/24/2019 7:23 AM  Nathan [name]  has presented today for surgery, with the diagnosis of MR.  The various methods of treatment have been discussed with the patient and family. After consideration of risks, benefits and other options for treatment, the patient has consented to  Procedure(s) with comments: [place] (MVR) (Right) - GLUTARALDEHYDE TRANSESOPHAGEAL ECHOCARDIOGRAM (TEE) (N/A) as a surgical intervention.  The patient's history has been reviewed, patient examined, no change in status, stable for surgery.  I have reviewed the patient's chart and labs.  Questions were answered to the patient's satisfaction.     Rexene Alberts

## 2019-06-24 NOTE — Anesthesia Procedure Notes (Signed)
Central Venous Catheter Insertion Performed by: anesthesiologist Start/End10/03/2019 7:50 AM, 06/24/2019 8:00 AM Patient location: Pre-op. Preanesthetic checklist: patient identified, IV checked, site marked, risks and benefits discussed, surgical consent, monitors and equipment checked, pre-op evaluation, timeout performed and anesthesia consent Hand hygiene performed  and maximum sterile barriers used  PA cath was placed.Swan type:thermodilution Procedure performed without using ultrasound guided technique. Attempts: 1 Patient tolerated the procedure well with no immediate complications.

## 2019-06-24 NOTE — Anesthesia Procedure Notes (Signed)
Procedure Name: Intubation Date/Time: 06/24/2019 8:56 AM Performed by: Clearnce Sorrel, CRNA Pre-anesthesia Checklist: Patient identified, Emergency Drugs available, Suction available and Patient being monitored Patient Re-evaluated:Patient Re-evaluated prior to induction Oxygen Delivery Method: Circle System Utilized Preoxygenation: Pre-oxygenation with 100% oxygen Induction Type: IV induction Ventilation: Mask ventilation without difficulty and Oral airway inserted - appropriate to patient size Laryngoscope Size: Mac and 4 Grade View: Grade I Endobronchial tube: Double lumen EBT and Left and 39 Fr Number of attempts: 1 Airway Equipment and Method: Stylet and Oral airway Placement Confirmation: ETT inserted through vocal cords under direct vision,  positive ETCO2 and breath sounds checked- equal and bilateral Secured at: 29 cm Tube secured with: Tape Dental Injury: Teeth and Oropharynx as per pre-operative assessment  Comments: Inserted by Reece Agar, SRNA

## 2019-06-24 NOTE — Anesthesia Procedure Notes (Signed)
Central Venous Catheter Insertion Performed by: Roberts Gaudy, MD, anesthesiologist Start/End10/03/2019 7:50 AM, 06/24/2019 8:00 AM Patient location: Pre-op. Preanesthetic checklist: patient identified, IV checked, site marked, risks and benefits discussed, surgical consent, monitors and equipment checked, pre-op evaluation, timeout performed and anesthesia consent Lidocaine 1% used for infiltration and patient sedated Hand hygiene performed  and maximum sterile barriers used  Catheter size: 8.5 Fr Sheath introducer Procedure performed using ultrasound guided technique. Ultrasound Notes:anatomy identified, needle tip was noted to be adjacent to the nerve/plexus identified, no ultrasound evidence of intravascular and/or intraneural injection and image(s) printed for medical record Attempts: 1 Following insertion, line sutured and dressing applied. Post procedure assessment: blood return through all ports, free fluid flow and no air  Patient tolerated the procedure well with no immediate complications.

## 2019-06-24 NOTE — Brief Op Note (Signed)
06/24/2019  12:32 PM  PATIENT:  Nathan [name]  58 y.o. male  PRE-OPERATIVE DIAGNOSIS:  Mitral Regurgitation  POST-OPERATIVE DIAGNOSIS:  Mitral Regurgitation  PROCEDURE:  Procedure(s): MINIMALLY INVASIVE MITRAL VALVE REPAIR (MVR) with a 32 Mitral Memo 4D (N/A) TRANSESOPHAGEAL ECHOCARDIOGRAM (TEE) (N/A)  SURGEON:  Surgeon(s) and Role:    * Rexene Alberts, MD - Primary  PHYSICIAN ASSISTANT: WAYNE GOLD PA-C  ANESTHESIA:   general  EBL:  600 mL  [name] ADMINISTERED:none  DRAINS: RIGHT PLEURAL AND PERICARDIAL CHEST DRAINS   LOCAL MEDICATIONS USED:  OTHER ON-Q PLACED  SPECIMEN:  Source of Specimen:  PORTION OF MITRAL VALVE LEAFLET  DISPOSITION OF SPECIMEN:  PATHOLOGY  COUNTS:  YES  TOURNIQUET:  * No tourniquets in log *  DICTATION: .Dragon Dictation  PLAN OF CARE: Admit to inpatient   PATIENT DISPOSITION:  ICU - intubated and hemodynamically stable.   Delay start of Pharmacological VTE agent (>24hrs) due to surgical [name] loss or risk of bleeding: yes  COMPLICATIONS: NO KNOWN

## 2019-06-24 NOTE — Progress Notes (Signed)
  Echocardiogram Echocardiogram Transesophageal has been performed.  Nathan [name] 06/24/2019, 9:24 AM

## 2019-06-25 ENCOUNTER — Inpatient Hospital Stay (HOSPITAL_COMMUNITY): Payer: PRIVATE HEALTH INSURANCE

## 2019-06-25 ENCOUNTER — Encounter (HOSPITAL_COMMUNITY): Payer: Self-pay | Admitting: Thoracic Surgery (Cardiothoracic Vascular Surgery)

## 2019-06-25 LAB — CBC
HCT: 30.7 % — ABNORMAL LOW (ref 39.0–52.0)
HCT: 32.4 % — ABNORMAL LOW (ref 39.0–52.0)
Hemoglobin: 10.3 g/dL — ABNORMAL LOW (ref 13.0–17.0)
Hemoglobin: 11 g/dL — ABNORMAL LOW (ref 13.0–17.0)
MCH: 29.2 pg (ref 26.0–34.0)
MCH: 29.6 pg (ref 26.0–34.0)
MCHC: 33.6 g/dL (ref 30.0–36.0)
MCHC: 34 g/dL (ref 30.0–36.0)
MCV: 87 fL (ref 80.0–100.0)
MCV: 87.1 fL (ref 80.0–100.0)
Platelets: 185 10*3/uL (ref 150–400)
Platelets: 222 10*3/uL (ref 150–400)
RBC: 3.53 MIL/uL — ABNORMAL LOW (ref 4.22–5.81)
RBC: 3.72 MIL/uL — ABNORMAL LOW (ref 4.22–5.81)
RDW: 13.6 % (ref 11.5–15.5)
RDW: 13.9 % (ref 11.5–15.5)
WBC: 13 10*3/uL — ABNORMAL HIGH (ref 4.0–10.5)
WBC: 18.3 10*3/uL — ABNORMAL HIGH (ref 4.0–10.5)
nRBC: 0 % (ref 0.0–0.2)
nRBC: 0 % (ref 0.0–0.2)

## 2019-06-25 LAB — BASIC METABOLIC PANEL
Anion gap: 9 (ref 5–15)
Anion gap: 11 (ref 5–15)
BUN: 8 mg/dL (ref 6–20)
BUN: 13 mg/dL (ref 6–20)
CO2: 23 mmol/L (ref 22–32)
CO2: 23 mmol/L (ref 22–32)
Calcium: 7.2 mg/dL — ABNORMAL LOW (ref 8.9–10.3)
Calcium: 7.4 mg/dL — ABNORMAL LOW (ref 8.9–10.3)
Chloride: 103 mmol/L (ref 98–111)
Chloride: 98 mmol/L (ref 98–111)
Creatinine, Ser: 0.73 mg/dL (ref 0.61–1.24)
Creatinine, Ser: 1.01 mg/dL (ref 0.61–1.24)
GFR calc Af Amer: >60 mL/min (ref >60)
GFR calc Af Amer: >60 mL/min (ref >60)
GFR calc non Af Amer: >60 mL/min (ref >60)
GFR calc non Af Amer: >60 mL/min (ref >60)
Glucose, Bld: 136 mg/dL — ABNORMAL HIGH (ref 70–99)
Glucose, Bld: 129 mg/dL — ABNORMAL HIGH (ref 70–99)
Potassium: 4.4 mmol/L (ref 3.5–5.1)
Potassium: 4.5 mmol/L (ref 3.5–5.1)
Sodium: 135 mmol/L (ref 135–145)
Sodium: 132 mmol/L — ABNORMAL LOW (ref 135–145)

## 2019-06-25 LAB — GLUCOSE, CAPILLARY
Glucose-Capillary: 110 mg/dL — ABNORMAL HIGH (ref 70–99)
Glucose-Capillary: 110 mg/dL — ABNORMAL HIGH (ref 70–99)
Glucose-Capillary: 120 mg/dL — ABNORMAL HIGH (ref 70–99)
Glucose-Capillary: 125 mg/dL — ABNORMAL HIGH (ref 70–99)
Glucose-Capillary: 130 mg/dL — ABNORMAL HIGH (ref 70–99)
Glucose-Capillary: 79 mg/dL (ref 70–99)
Glucose-Capillary: 122 mg/dL — ABNORMAL HIGH (ref 70–99)
Glucose-Capillary: 97 mg/dL (ref 70–99)
Glucose-Capillary: 136 mg/dL — ABNORMAL HIGH (ref 70–99)
Glucose-Capillary: 114 mg/dL — ABNORMAL HIGH (ref 70–99)
Glucose-Capillary: 119 mg/dL — ABNORMAL HIGH (ref 70–99)

## 2019-06-25 LAB — SURGICAL PATHOLOGY

## 2019-06-25 LAB — MAGNESIUM
Magnesium: 2.2 mg/dL (ref 1.7–2.4)
Magnesium: 2.2 mg/dL (ref 1.7–2.4)

## 2019-06-25 MED ORDER — WARFARIN SODIUM 5 MG PO TABS
5.0000 mg | ORAL_TABLET | Freq: Every day | ORAL | Status: DC
  Administered 2019-06-25 – 2019-06-27 (×3): 5 mg via ORAL
  Filled 2019-06-25 (×3): qty 1

## 2019-06-25 MED ORDER — INSULIN ASPART 100 UNIT/ML ~~LOC~~ SOLN: 0.0000 [IU] | SUBCUTANEOUS | Status: DC

## 2019-06-25 MED ORDER — POTASSIUM CHLORIDE CRYS ER 20 MEQ PO TBCR
20.0000 meq | EXTENDED_RELEASE_TABLET | Freq: Every day | ORAL | Status: DC
  Administered 2019-06-25 – 2019-06-29 (×5): 20 meq via ORAL
  Filled 2019-06-25 (×5): qty 1

## 2019-06-25 MED ORDER — ASPIRIN EC 81 MG PO TBEC
81.0000 mg | DELAYED_RELEASE_TABLET | Freq: Every day | ORAL | Status: DC
  Administered 2019-06-25 – 2019-06-29 (×5): 81 mg via ORAL
  Filled 2019-06-25 (×5): qty 1

## 2019-06-25 MED ORDER — ENOXAPARIN SODIUM 40 MG/0.4ML ~~LOC~~ SOLN
40.0000 mg | Freq: Every day | SUBCUTANEOUS | Status: DC
  Administered 2019-06-26 – 2019-06-28 (×3): 40 mg via SUBCUTANEOUS
  Filled 2019-06-25 (×3): qty 0.4

## 2019-06-25 MED ORDER — MOVING RIGHT ALONG BOOK
Freq: Once | Status: DC
  Filled 2019-06-25 (×2): qty 1

## 2019-06-25 MED ORDER — FUROSEMIDE 10 MG/ML IJ SOLN
20.0000 mg | Freq: Two times a day (BID) | INTRAMUSCULAR | Status: AC
  Administered 2019-06-25 (×2): 20 mg via INTRAVENOUS
  Filled 2019-06-25 (×2): qty 2

## 2019-06-25 MED ORDER — PANTOPRAZOLE SODIUM 40 MG PO TBEC
40.0000 mg | DELAYED_RELEASE_TABLET | Freq: Every day | ORAL | Status: DC
  Administered 2019-06-25 – 2019-06-29 (×5): 40 mg via ORAL
  Filled 2019-06-25 (×5): qty 1

## 2019-06-25 MED ORDER — METOPROLOL TARTRATE 12.5 MG HALF TABLET
12.5000 mg | ORAL_TABLET | Freq: Two times a day (BID) | ORAL | Status: DC
  Administered 2019-06-25 – 2019-06-27 (×6): 12.5 mg via ORAL
  Filled 2019-06-25 (×6): qty 1

## 2019-06-25 MED ORDER — FUROSEMIDE 40 MG PO TABS: 40.0000 mg | ORAL_TABLET | Freq: Every day | ORAL | Status: DC

## 2019-06-25 MED ORDER — WARFARIN - PHYSICIAN DOSING INPATIENT: Freq: Every day | Status: DC

## 2019-06-25 NOTE — Addendum Note (Signed)
Addendum  created 06/25/19 0831 by Josephine Igo, CRNA   Order list changed

## 2019-06-25 NOTE — Discharge Instructions (Addendum)
Mitral Valve Replacement, Care After This sheet gives you information about how to care for yourself after your procedure. Your health care provider may also give you more specific instructions. If you have problems or questions, contact your health care provider. What can I expect after the procedure? After the procedure, it is common to have pain at the incision area. This may last for several weeks. Follow these instructions at home: Incision care   Follow instructions from your health care provider about how to take care of your incision. Make sure you: ? Wash your hands with soap and water before and after you change your bandage (dressing). If soap and water are not available, use hand sanitizer. ? Change your dressing as told by your health care provider. ? Leave stitches (sutures), skin glue, or adhesive strips in place. These skin closures may need to stay in place for 2 weeks or longer. If adhesive strip edges start to loosen and curl up, you may trim the loose edges. Do not remove adhesive strips completely unless your health care provider tells you to do that.  Check your incision area every day for signs of infection. Check for: ? Redness, swelling, or pain. ? Fluid or blood. ? Warmth. ? Pus or a bad smell.  Do not apply powder or lotion to the area. Bathing  Do not take baths, swim, or use a hot tub until your health care provider approves. You may shower  To wash the incision site, gently wash with soap and water and pat the area dry with a clean towel. Do not rub the incision area. That may cause bleeding. Activity  Rest as told by your health care provider.  Avoid sitting for a long time without moving, and avoid crossing your legs. Get up to take short walks every 1-2 hours. This is important to improve blood flow and breathing. Ask for help if you feel weak or unsteady.  Return to your normal activities as told by your health care provider. Ask your health care  provider what activities are safe for you.  Avoid the following activities for 6-8 weeks, or as long as directed: ? Lifting anything that is heavier than 10 lb (4.5 kg), or the limit that you are told. ? Pushing or pulling things with your arms.  Avoid climbing stairs and using the handrail to pull yourself up for the first 2-3 weeks after surgery.  Avoid airplane travel for 4-6 weeks, or as long as directed.  If you are taking blood thinners (anticoagulants), avoid activities that have a high risk of injury. Ask your health care provider what activities are safe for you. Medicines  Take over-the-counter and prescription medicines only as told by your health care provider.  If you are taking blood thinners: ? Talk with your health care provider before you take any medicines that contain aspirin or NSAIDs. These medicines increase your risk for dangerous bleeding. ? Take your medicine exactly as told, at the same time every day. ? Avoid activities that could cause injury or bruising. ? Follow instructions about how to prevent falls. ? Wear a medical alert bracelet or carry a card that lists what medicines you take.  Ask your health care provider if the medicine prescribed to you: ? Requires you to avoid driving or using heavy machinery. ? Can cause constipation. You may need to take actions to prevent or treat constipation, such as:  Drink enough fluid to keep your urine pale yellow.  Take over-the-counter or  prescription medicines.  Eat foods that are high in fiber, such as beans, whole grains, and fresh fruits and vegetables.  Limit foods that are high in fat and processed sugars, such as fried or sweet foods. General instructions   Take your temperature every day and weigh yourself every morning for the first 7 days after surgery. Write your temperatures and weight down and take this record with you to any follow-up visits.  Wear compression stockings as told by your health  care provider. These stockings may help to prevent blood clots and reduce swelling in your legs. You may be asked to wear these stockings for at least 2 weeks after your procedure. If your ankles are swollen after 2 weeks, contact your health care provider to see if you should continue to wear the stockings.  Follow instructions from your health care provider about eating or drinking restrictions.  Do not drink alcohol until your health care provider approves.  Do not drive until your health care provider approves.  Do not use any products that contain nicotine or tobacco, such as cigarettes, e-cigarettes, and chewing tobacco. If you need help quitting, ask your health care provider.  Keep all follow-up visits as told by your health care provider. This is important. Contact a health care provider if:  You develop a skin rash.  Your weight is increasing each day over 2-3 days.  You gain 2 lb (1 kg) or more in a single day. Get help right away if:  You develop chest pain that feels different from the pain caused by your incision.  You develop shortness of breath or difficulty breathing.  You have a fever.  You have redness, swelling, or pain around your incision.  You have fluid or blood coming from your incision.  Your incision feels warm to the touch.  You have pus or a bad smell coming from your incision.  You feel light-headed. Summary  After the procedure, it is common to have pain at the incision area. This may last for several weeks.  Take your temperature every day and weigh yourself every morning for the first 7 days after surgery.  Check your incision area every day for signs of infection.  Keep all follow-up visits as told by your health care provider. This is important. This information is not intended to replace advice given to you by your health care provider. Make sure you discuss any questions you have with your health care provider. Document Released:  03/23/2005 Document Revised: 05/27/2018 Document Reviewed: 05/27/2018 Elsevier Patient Education  2020 [employer] Information on my medicine - Coumadin   (Warfarin)  Why was Coumadin prescribed for you? Coumadin was prescribed for you because you have a blood clot or a medical condition that can cause an increased risk of forming blood clots. Blood clots can cause serious health problems by blocking the flow of blood to the heart, lung, or brain. Coumadin can prevent harmful blood clots from forming. As a reminder your indication for Coumadin is:   Blood Clot Prevention After Heart Valve Surgery  What test will check on my response to Coumadin? While on Coumadin (warfarin) you will need to have an INR test regularly to ensure that your dose is keeping you in the desired range. The INR (international normalized ratio) number is calculated from the result of the laboratory test called prothrombin time (PT).  If an INR APPOINTMENT HAS NOT ALREADY BEEN MADE FOR YOU please schedule an appointment to have this lab work  done by your health care provider within 7 days. Your INR goal is usually a number between:  2 to 3 or your provider may give you a more narrow range like 2-2.5.  Ask your health care provider during an office visit what your goal INR is.  What  do you need to  know  About  COUMADIN? Take Coumadin (warfarin) exactly as prescribed by your healthcare provider about the same time each day.  DO NOT stop taking without talking to the doctor who prescribed the medication.  Stopping without other blood clot prevention medication to take the place of Coumadin may increase your risk of developing a new clot or stroke.  Get refills before you run out.  What do you do if you miss a dose? If you miss a dose, take it as soon as you remember on the same day then continue your regularly scheduled regimen the next day.  Do not take two doses of Coumadin at the same time.  Important Safety  Information A possible side effect of Coumadin (Warfarin) is an increased risk of bleeding. You should call your healthcare provider right away if you experience any of the following: ? Bleeding from an injury or your nose that does not stop. ? Unusual colored urine (red or dark brown) or unusual colored stools (red or black). ? Unusual bruising for unknown reasons. ? A serious fall or if you hit your head (even if there is no bleeding).  Some foods or medicines interact with Coumadin (warfarin) and might alter your response to warfarin. To help avoid this: ? Eat a balanced diet, maintaining a consistent amount of Vitamin K. ? Notify your provider about major diet changes you plan to make. ? Avoid alcohol or limit your intake to 1 drink for women and 2 drinks for men per day. (1 drink is 5 oz. wine, 12 oz. beer, or 1.5 oz. liquor.)  Make sure that ANY health care provider who prescribes medication for you knows that you are taking Coumadin (warfarin).  Also make sure the healthcare provider who is monitoring your Coumadin knows when you have started a new medication including herbals and non-prescription products.  Coumadin (Warfarin)  Major Drug Interactions  Increased Warfarin Effect Decreased Warfarin Effect  Alcohol (large quantities) Antibiotics (esp. Septra/Bactrim, Flagyl, Cipro) Amiodarone (Cordarone) Aspirin (ASA) Cimetidine (Tagamet) Megestrol (Megace) NSAIDs (ibuprofen, naproxen, etc.) Piroxicam (Feldene) Propafenone (Rythmol SR) Propranolol (Inderal) Isoniazid (INH) Posaconazole (Noxafil) Barbiturates (Phenobarbital) Carbamazepine (Tegretol) Chlordiazepoxide (Librium) Cholestyramine (Questran) Griseofulvin Oral Contraceptives Rifampin Sucralfate (Carafate) Vitamin K   Coumadin (Warfarin) Major Herbal Interactions  Increased Warfarin Effect Decreased Warfarin Effect  Garlic Ginseng Ginkgo biloba Coenzyme Q10 Green tea St. Johns wort    Coumadin (Warfarin)  FOOD Interactions  Eat a consistent number of servings per week of foods HIGH in Vitamin K (1 serving =  cup)  Collards (cooked, or boiled & drained) Kale (cooked, or boiled & drained) Mustard greens (cooked, or boiled & drained) Parsley *serving size only =  cup Spinach (cooked, or boiled & drained) Swiss chard (cooked, or boiled & drained) Turnip greens (cooked, or boiled & drained)  Eat a consistent number of servings per week of foods MEDIUM-HIGH in Vitamin K (1 serving = 1 cup)  Asparagus (cooked, or boiled & drained) Broccoli (cooked, boiled & drained, or raw & chopped) Brussel sprouts (cooked, or boiled & drained) *serving size only =  cup Lettuce, raw (green leaf, endive, romaine) Spinach, raw Turnip greens, raw & chopped  These websites have more information on Coumadin (warfarin):  FailFactory.se; VeganReport.com.au;

## 2019-06-25 NOTE — Progress Notes (Signed)
Anesthesiology Follow-up:  Awake and alert in good spirits, hemodynamically stable, ambulating with assistance. Taking PO well.  VS: T-36.6 BP- 100/75 RR- 24 HR- 84 (SR) O2 sat 97% on RA  K- 4.5 Na- 132 BUN/Cr.- 13/1.01 glucose- 111 H/H- 11.0/32.4 platelets- 222,000  CXR: mild R. 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 Extubated in OR. Stable post-op course.  Roberts Gaudy

## 2019-06-25 NOTE — Progress Notes (Addendum)
[facility] DAILY ICU PROGRESS NOTE                   [address]            [place] [address]          [phone]   1 Day Post-Op Procedure(s) (LRB): MINIMALLY INVASIVE MITRAL VALVE REPAIR (MVR) with a 32 Mitral Memo 4D (N/A) TRANSESOPHAGEAL ECHOCARDIOGRAM (TEE) (N/A)  Total Length of Stay:  LOS: 1 day   Subjective: Feels very well, pain well controlled  Objective: Vital signs in last 24 hours: Temp:  [95.5 F (35.3 C)-98.6 F (37 C)] 98.4 F (36.9 C) (10/08 0700) Pulse Rate:  [76-95] 95 (10/08 0700) Cardiac Rhythm: Normal sinus rhythm (10/08 0000) Resp:  [10-26] 23 (10/08 0700) BP: (85-134)/(63-118) 112/81 (10/08 0700) SpO2:  [94 %-100 %] 95 % (10/08 0700) Arterial Line BP: (100-162)/(50-82) 129/74 (10/08 0700) Weight:  [97.3 kg] 97.3 kg (10/08 0445)  Filed Weights   06/24/19 0712 06/25/19 0445  Weight: 94 kg 97.3 kg    Weight change:    Hemodynamic parameters for last 24 hours: PAP: (14-32)/(3-23) 31/23 CO:  [5.4 L/min-9.5 L/min] 5.9 L/min CI:  [2.4 L/min/m2-4.3 L/min/m2] 2.7 L/min/m2  Intake/Output from previous day: 10/07 0701 - 10/08 0700 In: 4884.3 [P.O.:300; I.V.:2840.5; Blood:500; IV Piggyback:1243.8] Out: 9147 [WGNFA:2130; Blood:600; Chest QMVH:8469]  Intake/Output this shift: No intake/output data recorded.  Current Meds: Scheduled Meds: . acetaminophen (TYLENOL) oral liquid 160 mg/5 mL  650 mg Per Tube Once   Or  . acetaminophen  650 mg Rectal Once  . acetaminophen  1,000 mg Oral Q6H  . aspirin EC  325 mg Oral Daily  . bisacodyl  10 mg Oral Daily   Or  . bisacodyl  10 mg Rectal Daily  . Chlorhexidine Gluconate Cloth  6 each Topical Daily  . docusate sodium  200 mg Oral Daily  . insulin aspart  0-24 Units Subcutaneous Q4H  . ketorolac  15 mg Intravenous Q6H  . levalbuterol  0.63 mg Nebulization TID  . mouth rinse  15 mL Mouth Rinse BID  . [START ON 06/26/2019] pantoprazole  40 mg Oral Daily  . sodium chloride flush  3 mL  Intravenous Q12H   Continuous Infusions: . sodium chloride 10 mL/hr at 06/25/19 0325  . sodium chloride    . sodium chloride 20 mL/hr at 06/24/19 1437  . albumin human 12.5 g (06/24/19 1942)  . cefUROXime (ZINACEF)  IV    . lactated ringers    . lactated ringers    . lactated ringers 10 mL/hr at 06/25/19 0600  . nitroGLYCERIN Stopped (06/25/19 0104)  . phenylephrine (NEO-SYNEPHRINE) Adult infusion    . vancomycin     PRN Meds:.sodium chloride, albumin human, lactated ringers, levalbuterol, metoprolol tartrate, morphine injection, ondansetron (ZOFRAN) IV, oxyCODONE, sodium chloride flush, traMADol  General appearance: alert, cooperative and no distress Neurologic: intact Heart: regular rate and rhythm and no murmur Lungs: clear to auscultation bilaterally Abdomen: benign exam Extremities: no significant edema Wound: dressings with some bloddy drainage  Lab Results: CBC: Recent Labs    06/24/19 2005 06/24/19 2017 06/25/19 0415  WBC 14.5*  --  13.0*  HGB 11.7* 11.6* 10.3*  HCT 35.7* 34.0* 30.7*  PLT 199  --  185   BMET:  Recent Labs    06/24/19 2017 06/25/19 0415  NA 140 135  K 3.7 4.4  CL 106 103  CO2  --  23  GLUCOSE 152* 136*  BUN 9 8  CREATININE 0.60* 0.73  CALCIUM  --  7.2*    CMET: Lab Results  Component Value Date   WBC 13.0 (H) 06/25/2019   HGB 10.3 (L) 06/25/2019   HCT 30.7 (L) 06/25/2019   PLT 185 06/25/2019   GLUCOSE 136 (H) 06/25/2019   ALT 13 06/19/2019   AST 16 06/19/2019   NA 135 06/25/2019   K 4.4 06/25/2019   CL 103 06/25/2019   CREATININE 0.73 06/25/2019   BUN 8 06/25/2019   CO2 23 06/25/2019   INR 1.3 (H) 06/24/2019   HGBA1C 5.5 06/19/2019      PT/INR:  Recent Labs    06/24/19 1426  LABPROT 15.6*  INR 1.3*   Radiology: Dg Chest Port 1 View  Result Date: 06/25/2019 CLINICAL DATA:  Chest tube status post mitral valve repair. EXAM: PORTABLE CHEST 1 VIEW COMPARISON:  Radiograph of June 24, 2019. FINDINGS: Stable  cardiomediastinal silhouette. Stable position of right-sided chest tube is noted. Minimal right apical pneumothorax is noted. Left lung is clear. Mild right basilar subsegmental atelectasis is noted. Bony thorax is unremarkable. IMPRESSION: Stable position of right-sided chest tube. Minimal right apical pneumothorax is noted. Mild right basilar atelectasis is noted. Electronically Signed   By: Lupita Raider M.D.   On: 06/25/2019 07:41   Dg Chest Port 1 View  Result Date: 06/24/2019 CLINICAL DATA:  Status post mitral valve repair. EXAM: PORTABLE CHEST 1 VIEW COMPARISON:  10-20 20 FINDINGS: 1427 hours. Left IJ pulmonary artery catheter tip is in the right main pulmonary artery. Right chest tube tip projects in the right lung apex. Presumed mediastinal/pericardial drain crosses the midline with the tip overlying the cardiac apex. Telemetry leads overlie the chest. Basilar atelectasis noted on the right. No evidence for right pneumothorax. Cardiopericardial silhouette is at upper limits of normal for size. IMPRESSION: Basilar atelectasis on the right without evidence for pneumothorax. Electronically Signed   By: Kennith Center M.D.   On: 06/24/2019 14:57     Assessment/Plan: S/P Procedure(s) (LRB): MINIMALLY INVASIVE MITRAL VALVE REPAIR (MVR) with a 32 Mitral Memo 4D (N/A) TRANSESOPHAGEAL ECHOCARDIOGRAM (TEE) (N/A)  1 doing well , hemodyn stable in sinus rhythm, afebrile- will d/c a line and [place]. Start low dose metoprolol 2 moderate CT drainage- will leave in place 3 minor ABL anemia- monitor, minor leukocytosis- reactive 4 renal fxn in normal range, low dose diuretics 5 BS in normal range, non-diabetic 6 initiate coumadin 7 tx to 4e, routine pulm toilet and cardiac rehab Rowe Clack [facility] 06/25/2019 7:47 AM  Pager [phone]   I have seen and examined the patient and agree with the assessment and plan as outlined.  Doing very well POD1.  Maintaining NSR w/ stable BP, breathing  comfortably on room air.  CXR looks good.  Mobilize.  D/C lines.  Start Coumadin.  Transfer 4E  Purcell Nails, MD 06/25/2019 8:16 AM

## 2019-06-25 NOTE — Progress Notes (Signed)
      [address]       [place] [address]             [phone]      POD # 1  Up in chair BP 97/66   Pulse 88   Temp 97.9 F (36.6 C) (Oral)   Resp (!) 24   Ht 6\' 3"  (1.905 m)   Wt 97.3 kg   SpO2 97%   BMI 26.81 kg/m   Intake/Output Summary (Last 24 hours) at 06/25/2019 1804 Last data filed at 06/25/2019 1600 Gross per 24 hour  Intake 2471.19 ml  Output 2525 ml  Net -53.81 ml   K= 4.5, Hct=32 CBG well controlled Doing well POD # 1  Steven C. Roxan Hockey, MD [facility] [phone]

## 2019-06-26 ENCOUNTER — Inpatient Hospital Stay (HOSPITAL_COMMUNITY): Payer: PRIVATE HEALTH INSURANCE

## 2019-06-26 LAB — BASIC METABOLIC PANEL
Anion gap: 9 (ref 5–15)
BUN: 17 mg/dL (ref 6–20)
CO2: 24 mmol/L (ref 22–32)
Calcium: 7.3 mg/dL — ABNORMAL LOW (ref 8.9–10.3)
Chloride: 96 mmol/L — ABNORMAL LOW (ref 98–111)
Creatinine, Ser: 0.91 mg/dL (ref 0.61–1.24)
GFR calc Af Amer: >60 mL/min (ref >60)
GFR calc non Af Amer: >60 mL/min (ref >60)
Glucose, Bld: 128 mg/dL — ABNORMAL HIGH (ref 70–99)
Potassium: 4.5 mmol/L (ref 3.5–5.1)
Sodium: 129 mmol/L — ABNORMAL LOW (ref 135–145)

## 2019-06-26 LAB — CBC
HCT: 29 % — ABNORMAL LOW (ref 39.0–52.0)
Hemoglobin: 9.4 g/dL — ABNORMAL LOW (ref 13.0–17.0)
MCH: 28.3 pg (ref 26.0–34.0)
MCHC: 32.4 g/dL (ref 30.0–36.0)
MCV: 87.3 fL (ref 80.0–100.0)
Platelets: 176 10*3/uL (ref 150–400)
RBC: 3.32 MIL/uL — ABNORMAL LOW (ref 4.22–5.81)
RDW: 13.8 % (ref 11.5–15.5)
WBC: 15.6 10*3/uL — ABNORMAL HIGH (ref 4.0–10.5)
nRBC: 0 % (ref 0.0–0.2)

## 2019-06-26 LAB — PROTIME-INR
INR: 1.1 (ref 0.8–1.2)
Prothrombin Time: 14.5 seconds (ref 11.4–15.2)

## 2019-06-26 LAB — GLUCOSE, CAPILLARY
Glucose-Capillary: 104 mg/dL — ABNORMAL HIGH (ref 70–99)
Glucose-Capillary: 108 mg/dL — ABNORMAL HIGH (ref 70–99)
Glucose-Capillary: 81 mg/dL (ref 70–99)
Glucose-Capillary: 89 mg/dL (ref 70–99)
Glucose-Capillary: 98 mg/dL (ref 70–99)
Glucose-Capillary: 98 mg/dL (ref 70–99)

## 2019-06-26 MED ORDER — MOMETASONE FURO-FORMOTEROL FUM 100-5 MCG/ACT IN AERO
2.0000 | INHALATION_SPRAY | Freq: Two times a day (BID) | RESPIRATORY_TRACT | Status: DC
  Administered 2019-06-26 – 2019-06-29 (×6): 2 via RESPIRATORY_TRACT
  Filled 2019-06-26 (×2): qty 8.8

## 2019-06-26 MED ORDER — FUROSEMIDE 10 MG/ML IJ SOLN
20.0000 mg | Freq: Four times a day (QID) | INTRAMUSCULAR | Status: AC
  Administered 2019-06-26 (×3): 20 mg via INTRAVENOUS
  Filled 2019-06-26 (×3): qty 2

## 2019-06-26 MED ORDER — FUROSEMIDE 40 MG PO TABS
40.0000 mg | ORAL_TABLET | Freq: Every day | ORAL | Status: AC
  Administered 2019-06-27 – 2019-06-29 (×3): 40 mg via ORAL
  Filled 2019-06-26 (×3): qty 1

## 2019-06-26 MED ORDER — FE FUMARATE-B12-VIT C-FA-IFC PO CAPS
1.0000 | ORAL_CAPSULE | Freq: Every day | ORAL | Status: DC
  Administered 2019-06-26 – 2019-06-29 (×4): 1 via ORAL
  Filled 2019-06-26 (×4): qty 1

## 2019-06-26 MED ORDER — ALUM & MAG HYDROXIDE-SIMETH 200-200-20 MG/5ML PO SUSP
30.0000 mL | Freq: Four times a day (QID) | ORAL | Status: DC | PRN
  Administered 2019-06-26: 30 mL via ORAL
  Filled 2019-06-26: qty 30

## 2019-06-26 NOTE — Progress Notes (Addendum)
[address]       [place] [address]             [phone]      2 Days Post-Op Procedure(s) (LRB): MINIMALLY INVASIVE MITRAL VALVE REPAIR (MVR) with a 32 Mitral Memo 4D (N/A) TRANSESOPHAGEAL ECHOCARDIOGRAM (TEE) (N/A) Subjective: Feels pretty well, some discomfort from chest tubes  Objective: Vital signs in last 24 hours: Temp:  [97.9 F (36.6 C)-98.8 F (37.1 C)] 98.8 F (37.1 C) (10/09 0400) Pulse Rate:  [76-93] 84 (10/09 0700) Cardiac Rhythm: Normal sinus rhythm (10/08 0800) Resp:  [13-25] 20 (10/09 0700) BP: (89-126)/(58-89) 95/67 (10/09 0400) SpO2:  [92 %-100 %] 97 % (10/09 0700) Arterial Line BP: (115-126)/(66-71) 115/71 (10/08 0900) Weight:  [100.4 kg] 100.4 kg (10/09 0700)  Hemodynamic parameters for last 24 hours: PAP: (31)/(21-22) 31/22  Intake/Output from previous day: 10/08 0701 - 10/09 0700 In: 2838.6 [P.O.:2160; I.V.:38.6; IV Piggyback:400.1] Out: 1695 [Urine:1225; Chest Tube:470] Intake/Output this shift: No intake/output data recorded.  General appearance: alert, cooperative and no distress Heart: regular rate and rhythm, no rub and no murmur Lungs: coarse BS Abdomen: benign Extremities: no edema Wound: dressings intact  Lab Results: Recent Labs    06/25/19 1624 06/26/19 0214  WBC 18.3* 15.6*  HGB 11.0* 9.4*  HCT 32.4* 29.0*  PLT 222 176   BMET:  Recent Labs    06/25/19 1624 06/26/19 0214  NA 132* 129*  K 4.5 4.5  CL 98 96*  CO2 23 24  GLUCOSE 129* 128*  BUN 13 17  CREATININE 1.01 0.91  CALCIUM 7.4* 7.3*    PT/INR:  Recent Labs    06/26/19 0214  LABPROT 14.5  INR 1.1   ABG    Component Value Date/Time   PHART 7.293 (L) 06/24/2019 1429   HCO3 23.8 06/24/2019 1429   TCO2 22 06/24/2019 2017   ACIDBASEDEF 4.0 (H) 06/24/2019 1429   O2SAT 99.0 06/24/2019 1429   CBG (last 3)  Recent Labs    06/25/19 1957 06/25/19 2359 06/26/19 0411  GLUCAP 119* 108* 98    Meds Scheduled Meds: .  acetaminophen (TYLENOL) oral liquid 160 mg/5 mL  650 mg Per Tube Once  . acetaminophen  1,000 mg Oral Q6H  . aspirin EC  81 mg Oral Daily  . bisacodyl  10 mg Oral Daily   Or  . bisacodyl  10 mg Rectal Daily  . Chlorhexidine Gluconate Cloth  6 each Topical Daily  . docusate sodium  200 mg Oral Daily  . enoxaparin (LOVENOX) injection  40 mg Subcutaneous QHS  . furosemide  40 mg Oral Daily  . insulin aspart  0-24 Units Subcutaneous Q4H  . mouth rinse  15 mL Mouth Rinse BID  . metoprolol tartrate  12.5 mg Oral BID  . moving right along book   Does not apply Once  . pantoprazole  40 mg Oral Daily  . potassium chloride  20 mEq Oral Daily  . sodium chloride flush  3 mL Intravenous Q12H  . warfarin  5 mg Oral q1800  . Warfarin - Physician Dosing Inpatient   Does not apply q1800   Continuous Infusions: . sodium chloride    . cefUROXime (ZINACEF)  IV 1.5 g (06/25/19 2153)  . lactated ringers     PRN Meds:.levalbuterol, metoprolol tartrate, morphine injection, ondansetron (ZOFRAN) IV, oxyCODONE, sodium chloride flush, traMADol  Xrays Dg Chest Port 1 View  Result Date: 06/25/2019 CLINICAL DATA:  Chest tube status post  mitral valve repair. EXAM: PORTABLE CHEST 1 VIEW COMPARISON:  Radiograph of June 24, 2019. FINDINGS: Stable cardiomediastinal silhouette. Stable position of right-sided chest tube is noted. Minimal right apical pneumothorax is noted. Left lung is clear. Mild right basilar subsegmental atelectasis is noted. Bony thorax is unremarkable. IMPRESSION: Stable position of right-sided chest tube. Minimal right apical pneumothorax is noted. Mild right basilar atelectasis is noted. Electronically Signed   By: Lupita Raider M.D.   On: 06/25/2019 07:41   Dg Chest Port 1 View  Result Date: 06/24/2019 CLINICAL DATA:  Status post mitral valve repair. EXAM: PORTABLE CHEST 1 VIEW COMPARISON:  10-20 20 FINDINGS: 1427 hours. Left IJ pulmonary artery catheter tip is in the right main pulmonary  artery. Right chest tube tip projects in the right lung apex. Presumed mediastinal/pericardial drain crosses the midline with the tip overlying the cardiac apex. Telemetry leads overlie the chest. Basilar atelectasis noted on the right. No evidence for right pneumothorax. Cardiopericardial silhouette is at upper limits of normal for size. IMPRESSION: Basilar atelectasis on the right without evidence for pneumothorax. Electronically Signed   By: Kennith Center M.D.   On: 06/24/2019 14:57    Assessment/Plan: S/P Procedure(s) (LRB): MINIMALLY INVASIVE MITRAL VALVE REPAIR (MVR) with a 32 Mitral Memo 4D (N/A) TRANSESOPHAGEAL ECHOCARDIOGRAM (TEE) (N/A)  1 doing well POD#2 2 hemodyn stable in sinus rhythm, BP runs relatively low 3 sats good on RA, some congestion on exam- add flutter valve, resume home symbicort 4 470 cc drainage from chest tubes /24 hours- cont for now 5 CXR some atx, some vasc fullness- cont diuretic, renal fxn, GFR is normal 6 no fevers, leukocytosis trend is improving 7 anemia a little less, monitor clinically. Platelet count normal 8 conts coumadin 9 tx to 4 east when bed available  LOS: 2 days    Renee Rival 06/26/2019 Pager [phone]  I have seen and examined the patient and agree with the assessment and plan as outlined.  Doing well.   Keep chest tubes at least 1 more day.  Mobilize.  Diuresis.  Awaiting bed on 4E for transfer.   Purcell Nails, MD 06/26/2019 8:12 AM

## 2019-06-26 NOTE — Plan of Care (Signed)

## 2019-06-26 NOTE — Progress Notes (Signed)
CARDIAC REHAB PHASE I   Came to walk with pt. Pts HR noted to be irregular with missed beats. RN made aware and EKG obtained. Pt states he is having an "off day" and is having pain and trouble getting a deep breath. Encouraged pt to use IS as able, but to slow down on his ambulation as he as walked long distances today. Will follow up with pt tomorrow.  [phone] Rufina Falco, RN BSN 06/26/2019 1:46 PM

## 2019-06-26 NOTE — Progress Notes (Signed)
Noticed patient had went into what looks like Afib in the 70s EKG obtained and says its an AV block. PA made aware, will continue to monitor.

## 2019-06-26 NOTE — Significant Event (Signed)
Patient has been transferred safely to 4E12, ambulated around unit of 2H then up to 4E per his requests. All personal belongings taken to his new room (clothes in black bag, cell phone and charger). Report given to receiving RN Jerene Pitch. Patient's spouse aware of the transfer. Staff in room prior to RN leaving.    Nathan [name]

## 2019-06-26 NOTE — Discharge Summary (Signed)
Physician Discharge Summary  Patient ID: RAMESH MOAN MRN: [id number] DOB/AGE: 1961-07-19 58 y.o.  Admit date: 06/24/2019 Discharge date: 06/29/2019  Admission Diagnoses: Severe mitral regurgitation  Discharge Diagnoses:  Principal Problem:   S/P minimally invasive mitral valve repair Active Problems:   Mitral regurgitation   Chronic diastolic heart failure [facility])  Patient Active Problem List   Diagnosis Date Noted  . S/P minimally invasive mitral valve repair 06/24/2019  . Mitral regurgitation 06/17/2019  . Chronic diastolic heart failure ([place]) 06/17/2019  HPI: at time of consultation  Patient is a 58 year old male with mitral valve prolapse who has been referred for surgical consultation to discuss treatment options for management of severe symptomatic primary mitral regurgitation.  Patient states that he first began to experience symptoms of exertional shortness of breath 4 or 5 years ago.  Symptoms developed somewhat suddenly and have persisted and gradually gotten worse.  The patient is fairly active physically but has noticed a definite decline in his tolerance with a tendency to get breathless during activity.  He has a persistent cough that is described as "hacky" and occasionally productive of thick sputum.  He reports occasional vague discomfort in his chest primarily associated with a sensation that is heart is pounding or racing.  He denies resting shortness of breath, PND, orthopnea, or lower extremity edema.  He has occasional very slight dizzy spells.  More than 6 months ago he was seen at his primary care physician's office by a provider who noted the presence of a systolic murmur on physical exam.  The patient underwent transthoracic and transesophageal echocardiograms at [facility] which revealed mitral valve prolapse with severe mitral regurgitation and normal left ventricular systolic function.  The patient ultimately was self-referred to our practice  and underwent diagnostic cardiac catheterization by Dr. Tamala Julian on June 17, 2019.  Catheterization revealed normal coronary artery anatomy with no significant coronary artery disease.  There was at least moderate mitral regurgitation.  Right heart pressures were normal.  Cardiothoracic surgical consultation was requested.  Patient is married and lives with his wife in [place].  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  This work keeps him quite busy with long days.  He does not exercise otherwise on his own but he does remain physically active and reports no other significant physical limitations.  He was admitted electively for minimally invasive mitral valve repair  Discharged Condition: good  Hospital Course: The patient was admitted electively and on 06/24/2019 he was taken to the operating room where he underwent the below described procedure.  He tolerated it well and was taken to the surgical intensive care unit in stable condition.  Postoperative hospital course:  The patient has done well.  He has maintained stable hemodynamics and normal sinus rhythm.  He has progressed in a routine manner with usual cardiac rehab.  Oxygen has been weaned and he maintains good saturations on room air.  He does have a mild expected acute blood loss anemia which is stable.  Incisions are noted to be healing well without evidence of infection.  All routine lines, monitors and drainage devices have been discontinued in the standard fashion.  He did have some first and second-degree AV block during the postoperative period and beta-blocker was adjusted.  Coumadin has been started with daily INRs.  At time of discharge his most recent INR is 1.3 and he will be on an alternating 7.5 and 5mg  daily  as scheduled.  He is tolerating gradually increasing activities using standard cardiac rehab protocols.  He did have a mild hyponatremia which has resolved.   Most recent sodium is normal at 1.36.  Renal function is also in the normal range.  Blood sugars have been under good control.  He does have a small right pleural effusion.  At the time of discharge the patient is felt to be quite stable.  Consults: None  Significant Diagnostic Studies: routine post-op labs and serial CXR's  Treatments: surgery:  CARDIOTHORACIC SURGERY OPERATIVE NOTE  Date of Procedure:                06/24/2019  Preoperative Diagnosis:      Severe Mitral Regurgitation  Postoperative Diagnosis:    Same  Procedure:        Minimally-Invasive Mitral Valve Repair             Complex valvuloplasty including triangular resection of flail segment (P2) of posterior leaflet             Artificial Gore-tex neochord placement x6             Sorin Memo 4D Ring Annuloplasty (size 32mm, catalog # [id number], serial # [id number])               Surgeon:        Salvatore Decentlarence H. Cornelius Moraswen, MD  Assistant:       Rowe ClackWayne E. Jailan Trimm, PA-C  Anesthesia:    Kipp Broodavid Joslin, MD  Operative Findings: ? Fibroelastic deficiency type myxomatous degenerative disease ? Multiple ruptured primary chordae tendinae with flail segment (P2) of posterior leaflet ? Type II dysfunction with severe mitral regurgitation ? Normal left ventricular systolic function ? No residual mitral regurgitation after successful valve repair Discharge Exam: Blood pressure 113/75, pulse 99, temperature 98.7 F (37.1 C), temperature source Oral, resp. rate 20, height 6\' 3"  (1.905 m), weight 95.4 kg, SpO2 97 %.   General appearance: alert, cooperative and no distress Heart: regular rate and rhythm and no murmur Lungs: min dim right base Abdomen: benign Extremities: no edema Wound: incis healing well  Disposition: Discharge disposition: 01-Home or Self Care       Discharge Instructions    Discharge patient   Complete by: As directed    Discharge disposition: 01-Home or Self Care   Discharge patient date: 06/29/2019      Allergies as of 06/29/2019   No Known Allergies     Medication List    TAKE these medications   aspirin 81 MG EC tablet Take 1 tablet (81 mg total) by mouth daily. Start taking on: June 30, 2019   budesonide-formoterol 80-4.5 MCG/ACT inhaler Commonly known as: SYMBICORT Inhale 1-2 puffs into the lungs See admin instructions. Inhale 2 puffs in the morning, may inhale 1 puff during the day as needed for shortness of breath   ferrous fumarate-b12-vitamic C-folic acid capsule Commonly known as: TRINSICON / FOLTRIN Take 1 capsule by mouth daily with breakfast. Start taking on: June 30, 2019   metoprolol tartrate 25 MG tablet Commonly known as: LOPRESSOR Take 0.5 tablets (12.5 mg total) by mouth 2 (two) times daily. Start taking on: June 30, 2019   Mucinex Maximum Strength 1200 MG Tb12 Generic drug: Guaifenesin Take 1,200 mg by mouth daily as needed (congestion).   omeprazole 40 MG capsule Commonly known as: PRILOSEC Take 40 mg by mouth daily.   oxyCODONE 5 MG immediate release tablet Commonly known as: Oxy IR/ROXICODONE Take 1 tablet (5  mg total) by mouth every 6 (six) hours as needed for up to 7 days for severe pain.   pantoprazole 40 MG tablet Commonly known as: PROTONIX Take 40 mg by mouth daily.   Rhinocort Aqua 32 MCG/ACT nasal spray Generic drug: budesonide Place 2 sprays into both nostrils daily.   warfarin 5 MG tablet Commonly known as: COUMADIN Take 1.5 tablets (7.5 mg total) by mouth daily at 6 PM. As directed by the coumadin clinic      Follow-up Information    Lyn Records, MD Follow up.   Specialty: Cardiology Why: Please see discharge paperwork for follow-up appointment with cardiology.  Also a Coumadin clinic appointment for management of your Coumadin dosing. Contact information: [address] [place] [address] [phone]        Purcell Nails, MD Follow up.   Specialty: Cardiothoracic Surgery Why:  Please see discharge paperwork for follow-up appointment with surgeon.  Also obtain a chest x-ray at [facility] imaging 1/2-hour prior to this appointment.  It is located in the same office complex on the first floor. Contact information: [address] [place] [address] [phone]        [facility] Follow up.   Specialty: Cardiothoracic Surgery Why: suture removal by nurse 07/03/2019 10:30 Contact information: [address] [place], [address] [place] [address] [phone]         Correction- patient instructed to take 5mg /7.5 mg alternating coumadin daily dose  Signed: PA-C 06/29/2019, 12:05 PM

## 2019-06-27 ENCOUNTER — Inpatient Hospital Stay (HOSPITAL_COMMUNITY): Payer: PRIVATE HEALTH INSURANCE

## 2019-06-27 LAB — BASIC METABOLIC PANEL
Anion gap: 9 (ref 5–15)
BUN: 14 mg/dL (ref 6–20)
CO2: 25 mmol/L (ref 22–32)
Calcium: 7.6 mg/dL — ABNORMAL LOW (ref 8.9–10.3)
Chloride: 93 mmol/L — ABNORMAL LOW (ref 98–111)
Creatinine, Ser: 0.69 mg/dL (ref 0.61–1.24)
GFR calc Af Amer: >60 mL/min (ref >60)
GFR calc non Af Amer: >60 mL/min (ref >60)
Glucose, Bld: 106 mg/dL — ABNORMAL HIGH (ref 70–99)
Potassium: 4.6 mmol/L (ref 3.5–5.1)
Sodium: 127 mmol/L — ABNORMAL LOW (ref 135–145)

## 2019-06-27 LAB — GLUCOSE, CAPILLARY
Glucose-Capillary: 95 mg/dL (ref 70–99)
Glucose-Capillary: 121 mg/dL — ABNORMAL HIGH (ref 70–99)
Glucose-Capillary: 120 mg/dL — ABNORMAL HIGH (ref 70–99)
Glucose-Capillary: 96 mg/dL (ref 70–99)

## 2019-06-27 LAB — CBC
HCT: 27.9 % — ABNORMAL LOW (ref 39.0–52.0)
Hemoglobin: 9.4 g/dL — ABNORMAL LOW (ref 13.0–17.0)
MCH: 28.6 pg (ref 26.0–34.0)
MCHC: 33.7 g/dL (ref 30.0–36.0)
MCV: 84.8 fL (ref 80.0–100.0)
Platelets: 220 10*3/uL (ref 150–400)
RBC: 3.29 MIL/uL — ABNORMAL LOW (ref 4.22–5.81)
RDW: 13.9 % (ref 11.5–15.5)
WBC: 14 10*3/uL — ABNORMAL HIGH (ref 4.0–10.5)
nRBC: 0 % (ref 0.0–0.2)

## 2019-06-27 LAB — PROTIME-INR
INR: 1.1 (ref 0.8–1.2)
Prothrombin Time: 14.1 seconds (ref 11.4–15.2)

## 2019-06-27 MED ORDER — INSULIN ASPART 100 UNIT/ML ~~LOC~~ SOLN: 0.0000 [IU] | Freq: Three times a day (TID) | SUBCUTANEOUS | Status: DC

## 2019-06-27 NOTE — Progress Notes (Signed)
Noted new sahara CT cannister this morning with 21mls, unmarked.  Placed Evertt at 0730.  Reassessment shows an additional 158mls, now charted in output flowsheet.  PA [facility] aware.  Will con't plan of care.

## 2019-06-27 NOTE — Progress Notes (Addendum)
      [address]       [place] [address]             [phone]        3 Days Post-Op Procedure(s) (LRB): MINIMALLY INVASIVE MITRAL VALVE REPAIR (MVR) with a 32 Mitral Memo 4D (N/A) TRANSESOPHAGEAL ECHOCARDIOGRAM (TEE) (N/A)  Subjective: Patient with incisional pain this am.  Objective: Vital signs in last 24 hours: Temp:  [97.7 F (36.5 C)-98.7 F (37.1 C)] 98.1 F (36.7 C) (10/10 0422) Pulse Rate:  [83-92] 86 (10/10 0422) Cardiac Rhythm: Normal sinus rhythm (10/10 0422) Resp:  [11-29] 17 (10/10 0422) BP: (100-137)/(74-89) 114/80 (10/10 0422) SpO2:  [93 %-100 %] 97 % (10/10 0758) Weight:  [102.7 kg] 102.7 kg (10/10 0422)  Pre op weight 91 kg Current Weight  06/27/19 102.7 kg      Intake/Output from previous day: 10/09 0701 - 10/10 0700 In: 800 [P.O.:600; IV Piggyback:200] Out: 2740 [Urine:2650; Chest Tube:90]   Physical Exam:  Cardiovascular: RRR Pulmonary: Slightly diminished at bases R>L Abdomen: Soft, non tender, bowel sounds present. Extremities: Mild bilateral lower extremity edema. Wounds: Aquacel removed and wound is clean and dry.  No erythema or signs of infection. Chest tubes: to suction, no air leak. Bloody like drainage on chest tube dressing.  Lab Results: CBC: Recent Labs    06/26/19 0214 06/27/19 0713  WBC 15.6* 14.0*  HGB 9.4* 9.4*  HCT 29.0* 27.9*  PLT 176 220   BMET:  Recent Labs    06/26/19 0214 06/27/19 0713  NA 129* 127*  K 4.5 4.6  CL 96* 93*  CO2 24 25  GLUCOSE 128* 106*  BUN 17 14  CREATININE 0.91 0.69  CALCIUM 7.3* 7.6*    PT/INR:  Lab Results  Component Value Date   INR 1.1 06/27/2019   INR 1.1 06/26/2019   INR 1.3 (H) 06/24/2019   ABG:  INR: Will add last result for INR, ABG once components are confirmed Will add last 4 CBG results once components are confirmed  Assessment/Plan:  1. CV - Had/has junctional, question AV block on tele/EKG yesterday. On Lopressor 12.5 mg bid and  Coumadin. INR this am 1.1. He has been given 2 doses of 5 mg, may have to increase if INR not increased in am. 2.  Pulmonary -Chest tubes with 90 cc of output recorded last 24 hours. Will remove as long as output stays low this am. CXR this am appears stable. Check CXR in am. Encourage incentive spirometer. 3. Volume Overload - On Lasix 40 mg daily 4.  Acute blood loss anemia - H and H this am 9.4 and 27.9. Continue Trinsicon 5. Hyponatremia-sodium 127. Likely secondary to diuresis  Donielle M ZimmermanPA-C 06/27/2019,8:17 AM [phone]  I have seen and examined the patient and agree with the assessment and plan as outlined.  Rhythm has been accelerated junctional.  No evidence for any AV block.  Continue low dose metoprolol.  Chest tubes still draining - will leave in place at 1 more day.  Rexene Alberts, MD 06/27/2019 12:28 PM

## 2019-06-27 NOTE — Progress Notes (Signed)
CARDIAC REHAB PHASE I   PRE:  Rate/Rhythm: 96 ?SR v junctional  BP:  Sitting: 130/83      SaO2: 97 RA  MODE:  Ambulation: 1400 ft   POST:  Rate/Rhythm: 110 ST?  BP:  Sitting: 145/92    SaO2: 98 RA  Pt ambulated 1452ft in hallway independently with front wheel walker. Pt needed reminders to slow down as he was not able to walk and talk at the same time. Pt to recliner after walk. Pt able to demonstrate 2250 on IS, encouraged continued use, and continued walks. Pt instructed on restrictions, site care, and exercise guidelines. Pt declining CRP II at this time.   [phone] Rufina Falco, RN BSN 06/27/2019 10:47 AM

## 2019-06-28 LAB — PROTIME-INR
INR: 1.2 (ref 0.8–1.2)
Prothrombin Time: 15.3 seconds — ABNORMAL HIGH (ref 11.4–15.2)

## 2019-06-28 LAB — GLUCOSE, CAPILLARY: Glucose-Capillary: 95 mg/dL (ref 70–99)

## 2019-06-28 MED ORDER — METOPROLOL TARTRATE 25 MG PO TABS
25.0000 mg | ORAL_TABLET | Freq: Two times a day (BID) | ORAL | Status: DC
  Administered 2019-06-28 (×2): 25 mg via ORAL
  Filled 2019-06-28: qty 1

## 2019-06-28 MED ORDER — WARFARIN SODIUM 5 MG PO TABS
5.0000 mg | ORAL_TABLET | Freq: Every day | ORAL | Status: DC
  Administered 2019-06-28: 5 mg via ORAL
  Filled 2019-06-28: qty 1

## 2019-06-28 MED ORDER — WARFARIN SODIUM 7.5 MG PO TABS: 7.5000 mg | ORAL_TABLET | Freq: Every day | ORAL | Status: DC

## 2019-06-28 MED ORDER — METOPROLOL TARTRATE 25 MG PO TABS
25.0000 mg | ORAL_TABLET | Freq: Two times a day (BID) | ORAL | Status: DC
  Filled 2019-06-28: qty 1

## 2019-06-28 NOTE — Progress Notes (Signed)
Pt ambulated x 1500 feet around unit, tolerated well

## 2019-06-28 NOTE — Progress Notes (Addendum)
      [address]       [place] [address]             [phone]        4 Days Post-Op Procedure(s) (LRB): MINIMALLY INVASIVE MITRAL VALVE REPAIR (MVR) with a 32 Mitral Memo 4D (N/A) TRANSESOPHAGEAL ECHOCARDIOGRAM (TEE) (N/A)  Subjective: Patient already walked in hallway this am. He feels pretty good this am.  Objective: Vital signs in last 24 hours: Temp:  [97.8 F (36.6 C)-99 F (37.2 C)] 97.9 F (36.6 C) (10/11 0438) Pulse Rate:  [88-105] 98 (10/11 0438) Cardiac Rhythm: Normal sinus rhythm (10/11 0732) Resp:  [18-23] 20 (10/11 0438) BP: (115-128)/(82-94) 124/94 (10/11 0438) SpO2:  [97 %-100 %] 99 % (10/11 0759) Weight:  [97.7 kg] 97.7 kg (10/11 0438)  Pre op weight 91 kg Current Weight  06/28/19 97.7 kg      Intake/Output from previous day: 10/10 0701 - 10/11 0700 In: 120 [P.O.:120] Out: 165 [Chest Tube:165]   Physical Exam:  Cardiovascular: Slightly tachy, no murmur Pulmonary: Slightly diminished at bases R>L Abdomen: Soft, non tender, bowel sounds present. Extremities: Mild bilateral lower extremity edema. Wounds: Clean and dry.  No erythema or signs of infection. Chest tubes: to suction, no air leak. Old bloody like drainage on chest tube dressing.  Lab Results: CBC: Recent Labs    06/26/19 0214 06/27/19 0713  WBC 15.6* 14.0*  HGB 9.4* 9.4*  HCT 29.0* 27.9*  PLT 176 220   BMET:  Recent Labs    06/26/19 0214 06/27/19 0713  NA 129* 127*  K 4.5 4.6  CL 96* 93*  CO2 24 25  GLUCOSE 128* 106*  BUN 17 14  CREATININE 0.91 0.69  CALCIUM 7.3* 7.6*    PT/INR:  Lab Results  Component Value Date   INR 1.2 06/28/2019   INR 1.1 06/27/2019   INR 1.1 06/26/2019   ABG:  INR: Will add last result for INR, ABG once components are confirmed Will add last 4 CBG results once components are confirmed  Assessment/Plan:  1. CV - Has had junctional rhythm.  Appears ST (and not accel junctional) in the low 100's this am. On  Lopressor 12.5 mg bid and Coumadin. INR this am 1.1. He has been given 2 doses of 5 mg and INR slow to increase. If not increased in am, may need to increase Coumadin dose. As discussed with Dr. Roxy Manns,  increase Lopressor for better HR control. 2.  Pulmonary -Chest tubes with 165 cc of output last 24 hours. Chest tube output has decreased. Will remove and check CXR in am. Encourage incentive spirometer. 3. Volume Overload - On Lasix 40 mg daily 4.  Acute blood loss anemia - Last H and H  9.4 and 27.9. Continue Trinsicon 5. Remove On Q 6. Possible discharge in am  Donielle M ZimmermanPA-C 06/28/2019,8:09 AM [phone]   I have seen and examined the patient and agree with the assessment and plan as outlined.  D/C tubes.  Likely D/C home tomorrow.  Rexene Alberts, MD 06/28/2019 11:27 AM

## 2019-06-28 NOTE — Progress Notes (Signed)
Pt ambulated around unit x 900 feet with rolling walker

## 2019-06-28 NOTE — Progress Notes (Signed)
Pt ambulating for first time without CT.  Feels great, walking at brisker pace than before.

## 2019-06-29 ENCOUNTER — Inpatient Hospital Stay (HOSPITAL_COMMUNITY): Payer: PRIVATE HEALTH INSURANCE

## 2019-06-29 ENCOUNTER — Other Ambulatory Visit: Payer: Self-pay

## 2019-06-29 LAB — BASIC METABOLIC PANEL
Anion gap: 9 (ref 5–15)
BUN: 15 mg/dL (ref 6–20)
CO2: 26 mmol/L (ref 22–32)
Calcium: 8.1 mg/dL — ABNORMAL LOW (ref 8.9–10.3)
Chloride: 101 mmol/L (ref 98–111)
Creatinine, Ser: 0.98 mg/dL (ref 0.61–1.24)
GFR calc Af Amer: >60 mL/min (ref >60)
GFR calc non Af Amer: >60 mL/min (ref >60)
Glucose, Bld: 120 mg/dL — ABNORMAL HIGH (ref 70–99)
Potassium: 3.6 mmol/L (ref 3.5–5.1)
Sodium: 136 mmol/L (ref 135–145)

## 2019-06-29 LAB — PROTIME-INR
INR: 1.3 — ABNORMAL HIGH (ref 0.8–1.2)
Prothrombin Time: 15.8 seconds — ABNORMAL HIGH (ref 11.4–15.2)

## 2019-06-29 MED ORDER — METOPROLOL TARTRATE 25 MG PO TABS: 12.5000 mg | ORAL_TABLET | Freq: Two times a day (BID) | ORAL | 1 refills | Status: DC

## 2019-06-29 MED ORDER — ASPIRIN 81 MG PO TBEC: 81.0000 mg | DELAYED_RELEASE_TABLET | Freq: Every day | ORAL | Status: DC

## 2019-06-29 MED ORDER — FE FUMARATE-B12-VIT C-FA-IFC PO CAPS: 1.0000 | ORAL_CAPSULE | Freq: Every day | ORAL | 1 refills | Status: AC

## 2019-06-29 MED ORDER — OXYCODONE HCL 5 MG PO TABS: 5.0000 mg | ORAL_TABLET | Freq: Four times a day (QID) | ORAL | 0 refills | Status: DC | PRN

## 2019-06-29 MED ORDER — WARFARIN SODIUM 7.5 MG PO TABS: 7.5000 mg | ORAL_TABLET | Freq: Every day | ORAL | Status: DC

## 2019-06-29 MED ORDER — WARFARIN SODIUM 5 MG PO TABS: 7.5000 mg | ORAL_TABLET | Freq: Every day | ORAL | 1 refills | Status: DC

## 2019-06-29 MED ORDER — METOPROLOL TARTRATE 12.5 MG HALF TABLET: 12.5000 mg | ORAL_TABLET | Freq: Two times a day (BID) | ORAL | Status: DC

## 2019-06-29 NOTE — Progress Notes (Signed)
Discharge AVS meds take and those due reviewed with pt. Follow up appointments and when to call MD reviewed. All questions and concerns addressed. No further questions at this time. D/c IV and TELE, CCMD notified. D/C home per orders. Pt brought out via wheelchair with staff and all belongings.  Amanda Cockayne, RN

## 2019-06-29 NOTE — Progress Notes (Signed)
CARDIAC REHAB PHASE I   Pt walking independently in hallway without difficulty. Pt states he is feeling much better since getting rid of chest tubes. Pt denies questions or concerns regarding discharge education. Pt declining CRP II at this time. Encouraged pt to slow pace, and not over do his activity.   [phone] Rufina Falco, RN BSN 06/29/2019 9:03 AM

## 2019-06-29 NOTE — Progress Notes (Addendum)
[address]       [place] [address]             [phone]      5 Days Post-Op Procedure(s) (LRB): MINIMALLY INVASIVE MITRAL VALVE REPAIR (MVR) with a 32 Mitral Memo 4D (N/A) TRANSESOPHAGEAL ECHOCARDIOGRAM (TEE) (N/A) Subjective: Feels well   Objective: Vital signs in last 24 hours: Temp:  [97.7 F (36.5 C)-98.7 F (37.1 C)] 98.4 F (36.9 C) (10/12 0433) Pulse Rate:  [63-106] 92 (10/12 0433) Cardiac Rhythm: Normal sinus rhythm (10/12 0433) Resp:  [18-23] 19 (10/12 0433) BP: (95-130)/(53-96) 115/73 (10/12 0433) SpO2:  [92 %-99 %] 95 % (10/12 0433) Weight:  [95.4 kg] 95.4 kg (10/12 0433)  Hemodynamic parameters for last 24 hours:    Intake/Output from previous day: 10/11 0701 - 10/12 0700 In: 240 [P.O.:240] Out: -  Intake/Output this shift: No intake/output data recorded.  General appearance: alert, cooperative and no distress Heart: regular rate and rhythm and no murmur Lungs: min dim right base Abdomen: benign Extremities: no edema Wound: incis healing well  Lab Results: Recent Labs    06/27/19 0713  WBC 14.0*  HGB 9.4*  HCT 27.9*  PLT 220   BMET:  Recent Labs    06/27/19 0713  NA 127*  K 4.6  CL 93*  CO2 25  GLUCOSE 106*  BUN 14  CREATININE 0.69  CALCIUM 7.6*    PT/INR:  Recent Labs    06/28/19 0449  LABPROT 15.3*  INR 1.2   ABG    Component Value Date/Time   PHART 7.293 (L) 06/24/2019 1429   HCO3 23.8 06/24/2019 1429   TCO2 22 06/24/2019 2017   ACIDBASEDEF 4.0 (H) 06/24/2019 1429   O2SAT 99.0 06/24/2019 1429   CBG (last 3)  Recent Labs    06/27/19 1608 06/27/19 2125 06/28/19 0625  GLUCAP 120* 96 95    Meds Scheduled Meds: . acetaminophen (TYLENOL) oral liquid 160 mg/5 mL  650 mg Per Tube Once  . acetaminophen  1,000 mg Oral Q6H  . aspirin EC  81 mg Oral Daily  . bisacodyl  10 mg Oral Daily   Or  . bisacodyl  10 mg Rectal Daily  . Chlorhexidine Gluconate Cloth  6 each Topical Daily  .  docusate sodium  200 mg Oral Daily  . enoxaparin (LOVENOX) injection  40 mg Subcutaneous QHS  . ferrous fumarate-b12-vitamic C-folic acid  1 capsule Oral Q breakfast  . furosemide  40 mg Oral Daily  . mouth rinse  15 mL Mouth Rinse BID  . metoprolol tartrate  25 mg Oral BID  . mometasone-formoterol  2 puff Inhalation BID  . moving right along book   Does not apply Once  . pantoprazole  40 mg Oral Daily  . potassium chloride  20 mEq Oral Daily  . sodium chloride flush  3 mL Intravenous Q12H  . warfarin  5 mg Oral q1800  . Warfarin - Physician Dosing Inpatient   Does not apply q1800   Continuous Infusions: . lactated ringers     PRN Meds:.alum & mag hydroxide-simeth, levalbuterol, metoprolol tartrate, morphine injection, ondansetron (ZOFRAN) IV, oxyCODONE, sodium chloride flush, traMADol  Xrays No results found.  Assessment/Plan: S/P Procedure(s) (LRB): MINIMALLY INVASIVE MITRAL VALVE REPAIR (MVR) with a 32 Mitral Memo 4D (N/A) TRANSESOPHAGEAL ECHOCARDIOGRAM (TEE) (N/A)  1 doing well overall 2 possible sick sinus syndrome with both tachy-brady components including afib- will d/w with MD 3 CXR shows small right  effusion- sats good on RA, continue gentle diuresis 4 recheck labs- last sodium 127 5 INR 1.2 , will increase coumadin dose to 7.5 6 routine pulm toilet /rehab  LOS: 5 days    Gaspar Bidding 06/29/2019 Pager [phone]  I have seen and examined the patient and agree with the assessment and plan as outlined except that there is no evidence of sick sinus syndrome.  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  He has had a couple very brief episodes of 2nd degree AV block since metoprolol was increased yesterday.  Overall his rhythm has remained quite stable for the past 48 hours.  Will hold metoprolol today and restart at 12.5 bid tomorrow.  D/C home today on Coumadin 7.5 alternating with 5 mg/day  Rexene Alberts, MD 06/29/2019 8:40 AM

## 2019-06-29 NOTE — TOC Transition Note (Signed)
Transition of Care [facility]) - CM/SW Discharge Note   Patient Details  Name: Nathan [name] MRN: [id number] Date of Birth: 06/11/1961  [facility] CM/SW Contact:  Zenon Mayo, RN Phone Number: 06/29/2019, 10:24 AM   Clinical Narrative:    Patient for dc today, NCM spoke with wife, she states his PCP is [facility] and they will make him a follow up apt.  He has insurance for medications . He has no needs.   Final next level of care: Home/Self Care Barriers to Discharge: No Barriers Identified   Patient Goals and CMS Choice Patient states their goals for this hospitalization and ongoing recovery are:: get better   Choice offered to / list presented to : NA  Discharge Placement                       Discharge Plan and Services                          HH Arranged: NA          Social Determinants of Health (SDOH) Interventions     Readmission Risk Interventions No flowsheet data found.

## 2019-06-30 MED FILL — Sodium Bicarbonate IV Soln 8.4%: INTRAVENOUS | Qty: 50 | Status: AC

## 2019-06-30 MED FILL — Sodium Chloride IV Soln 0.9%: INTRAVENOUS | Qty: 3000 | Status: AC

## 2019-06-30 MED FILL — Heparin Sodium (Porcine) Inj 1000 Unit/ML: INTRAMUSCULAR | Qty: 30 | Status: AC

## 2019-06-30 MED FILL — Lidocaine HCl Local Soln Prefilled Syringe 100 MG/5ML (2%): INTRAMUSCULAR | Qty: 15 | Status: AC

## 2019-06-30 MED FILL — Electrolyte-R (PH 7.4) Solution: INTRAVENOUS | Qty: 3000 | Status: AC

## 2019-06-30 MED FILL — Lidocaine HCl Local Preservative Free (PF) Inj 2%: INTRAMUSCULAR | Qty: 15 | Status: AC

## 2019-06-30 MED FILL — Potassium Chloride Inj 2 mEq/ML: INTRAVENOUS | Qty: 40 | Status: AC

## 2019-06-30 MED FILL — Mannitol IV Soln 20%: INTRAVENOUS | Qty: 500 | Status: AC

## 2019-06-30 MED FILL — Heparin Sodium (Porcine) Inj 1000 Unit/ML: INTRAMUSCULAR | Qty: 20 | Status: AC

## 2019-07-01 ENCOUNTER — Telehealth: Payer: Self-pay | Admitting: Thoracic Surgery (Cardiothoracic Vascular Surgery)

## 2019-07-01 ENCOUNTER — Telehealth: Payer: Self-pay

## 2019-07-01 ENCOUNTER — Encounter: Payer: Self-pay | Admitting: Thoracic Surgery (Cardiothoracic Vascular Surgery)

## 2019-07-01 DIAGNOSIS — G8918 Other acute postprocedural pain: Secondary | ICD-10-CM

## 2019-07-01 MED ORDER — TRAMADOL HCL 50 MG PO TABS: 50.0000 mg | ORAL_TABLET | Freq: Four times a day (QID) | ORAL | 0 refills | Status: DC | PRN

## 2019-07-01 NOTE — Telephone Encounter (Signed)
Returned a call that came into our office earlier today from patient's wife who reports that the patient took oxycodone this morning when he woke up at 4:30am and ever since then he has been "foggy".  She states that he reports that he feels well, he is up and ambulating, appears strong with no focal findings whatsoever, but he does not recall having surgery and he has been mildly confused.  I instructed patient's wife to flush the oxycodone tablets down the toilet.  We will substitute a prescription for tramadol to be utilized for postoperative pain control as needed.  Patient's wife was instructed to contact us if the patient's confusion does not resolve within 24 hours of stopping oxycodone.  Rexene Alberts, MD 07/01/2019 10:23 AM

## 2019-07-01 NOTE — Telephone Encounter (Signed)
-----   Message from Rexene Alberts, MD sent at 07/01/2019 10:19 AM EDT ----- Regarding: RE: Urgent issue Contact: [phone] I spoke with his wife.  It sounds like it's the oxycodone.  I told her to flush it down the toilet. Please send him a script for tramadol ASAP ----- Message ----- From: Marcia Brash, RN Sent: 07/01/2019   9:51 AM EDT To: Nathan Giovanni, PA-C, Rexene Alberts, MD Subject: Urgent issue                                   Nathan [name] wife, Nathan [name], called the office x 2 early this AM d/t pt waking up at 4:30 this morning w/ confusion. States pt doesn't recall having surgery (MVR on 06/24/19). Pt's family visited him this AM and he doesn't even remember this. Wife states that pt is moving all extremities and has no slurred speech or s/s of stroke that she's aware of. Denies medication changes other than a small adjustment to his Coumadin dosage. Has been taking OxyIR since d/c w/o issues. Advised her to call pt's PCP and/or to take pt to the ED or urgent care d/t sudden onset of confusion, but she was adamant that Nathan [name] be made aware of this. Nathan [name] was made aware and requested that you be contacted. Any suggestions?   Thank you,  Leafy Ro [phone]

## 2019-07-01 NOTE — Telephone Encounter (Signed)
-----   Message from Rexene Alberts, MD sent at 07/01/2019 10:11 AM EDT ----- Regarding: RE: Urgent issue Contact: [phone] This is not the kind of thing that should be referred to patient's primary care physician. I will call her personally ----- Message ----- From: Marcia Brash, RN Sent: 07/01/2019   9:51 AM EDT To: John Giovanni, PA-C, Rexene Alberts, MD Subject: Urgent issue                                   Mr. Arquette wife, Starla Link, called the office x 2 early this AM d/t pt waking up at 4:30 this morning w/ confusion. States pt doesn't recall having surgery (MVR on 06/24/19). Pt's family visited him this AM and he doesn't even remember this. Wife states that pt is moving all extremities and has no slurred speech or s/s of stroke that she's aware of. Denies medication changes other than a small adjustment to his Coumadin dosage. Has been taking OxyIR since d/c w/o issues. Advised her to call pt's PCP and/or to take pt to the ED or urgent care d/t sudden onset of confusion, but she was adamant that Patrick Jupiter be made aware of this. Patrick Jupiter was made aware and requested that you be contacted. Any suggestions?   Thank you,  Leafy Ro [phone]

## 2019-07-02 ENCOUNTER — Ambulatory Visit (INDEPENDENT_AMBULATORY_CARE_PROVIDER_SITE_OTHER): Payer: PRIVATE HEALTH INSURANCE | Admitting: *Deleted

## 2019-07-02 ENCOUNTER — Other Ambulatory Visit: Payer: Self-pay

## 2019-07-02 DIAGNOSIS — Z9889 Other specified postprocedural states: Secondary | ICD-10-CM | POA: Diagnosis not present

## 2019-07-02 DIAGNOSIS — Z5181 Encounter for therapeutic drug level monitoring: Secondary | ICD-10-CM | POA: Diagnosis not present

## 2019-07-02 LAB — POCT INR: INR: 1.3 — AB (ref 2.0–3.0)

## 2019-07-02 NOTE — Patient Instructions (Addendum)
A full discussion of the nature of anticoagulants has been carried out.  A benefit risk analysis has been presented to the patient, so that they understand the justification for choosing anticoagulation at this time. The need for frequent and regular monitoring, precise dosage adjustment and compliance is stressed.  Side effects of potential bleeding are discussed.  The patient should avoid any OTC items containing aspirin or ibuprofen, and should avoid great swings in general diet.  Avoid alcohol consumption.  Call if any signs of abnormal bleeding.   Description   Today take 1.5 tablets then start taking 1 tablet daily except 1.5 tablets on Monday, Wednesday, and Friday. Recheck INR in 1 week. Coumadin Clinic [phone] Main [phone]

## 2019-07-03 ENCOUNTER — Other Ambulatory Visit: Payer: Self-pay | Admitting: Thoracic Surgery (Cardiothoracic Vascular Surgery)

## 2019-07-03 ENCOUNTER — Ambulatory Visit (INDEPENDENT_AMBULATORY_CARE_PROVIDER_SITE_OTHER): Payer: Self-pay

## 2019-07-03 DIAGNOSIS — Z9889 Other specified postprocedural states: Secondary | ICD-10-CM

## 2019-07-03 DIAGNOSIS — Z4802 Encounter for removal of sutures: Secondary | ICD-10-CM

## 2019-07-03 NOTE — Progress Notes (Signed)
Removed 2 sutures from chest tube incision sites, no signs of infection and patient tolerated well. He is scheduled to see Dr Roxy Manns on Monday 07/06/19 with CXR

## 2019-07-06 ENCOUNTER — Ambulatory Visit: Payer: Self-pay | Admitting: Thoracic Surgery (Cardiothoracic Vascular Surgery)

## 2019-07-06 ENCOUNTER — Ambulatory Visit (INDEPENDENT_AMBULATORY_CARE_PROVIDER_SITE_OTHER): Payer: Self-pay | Admitting: Physician Assistant

## 2019-07-06 ENCOUNTER — Encounter: Payer: Self-pay | Admitting: Physician Assistant

## 2019-07-06 ENCOUNTER — Ambulatory Visit
Admission: RE | Admit: 2019-07-06 | Discharge: 2019-07-06 | Disposition: A | Discharge status: Home / Self Care | Payer: PRIVATE HEALTH INSURANCE | Source: Ambulatory Visit | Attending: Thoracic Surgery (Cardiothoracic Vascular Surgery) | Admitting: Thoracic Surgery (Cardiothoracic Vascular Surgery)

## 2019-07-06 ENCOUNTER — Other Ambulatory Visit: Payer: Self-pay

## 2019-07-06 VITALS — BP 122/80 | HR 103 | Temp 97.7°F | Resp 20 | Ht 75.0 in | Wt 205.0 lb

## 2019-07-06 DIAGNOSIS — Z9889 Other specified postprocedural states: Secondary | ICD-10-CM

## 2019-07-06 DIAGNOSIS — I340 Nonrheumatic mitral (valve) insufficiency: Secondary | ICD-10-CM

## 2019-07-06 NOTE — Progress Notes (Signed)
[address]       [place] [address]             [phone]      Nathan [name] is a 58 y.o. male patient status post minimally invasive mitral valve repair on 06/24/2019.  He is here for his routine 2-week follow-up appointment.  He did have some first and second-degree AV block while in the hospital and his beta-blocker was adjusted.  He was started on Coumadin therapy and has been followed outpatient.  Most recent INR was done on 07/06/2019 and it was 1.3.   No diagnosis found. Past Medical History:  Diagnosis Date   Allergic asthma    Cough    Epigastric pain    Fast heart beat    GERD (gastroesophageal reflux disease)    Heartburn    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    History of pulmonary embolus (PE)    History of transesophageal echocardiography (TEE)    ABNORMAL   Hx of colonic polyps    Mitral valve regurgitation    Murmur, cardiac    Ringing in ear    S/P minimally invasive mitral valve repair 06/24/2019   No past surgical history pertinent negatives on file. Scheduled Meds: Current Outpatient Medications on File Prior to Visit  Medication Sig Dispense Refill   aspirin EC 81 MG EC tablet Take 1 tablet (81 mg total) by mouth daily.     budesonide (RHINOCORT AQUA) 32 MCG/ACT nasal spray Place 2 sprays into both nostrils daily.      budesonide-formoterol (SYMBICORT) 80-4.5 MCG/ACT inhaler Inhale 1-2 puffs into the lungs See admin instructions. Inhale 2 puffs in the morning, may inhale 1 puff during the day as needed for shortness of breath     ferrous fumarate-b12-vitamic C-folic acid (TRINSICON / FOLTRIN) capsule Take 1 capsule by mouth daily with breakfast. 30 capsule 1   Guaifenesin (MUCINEX MAXIMUM STRENGTH) 1200 MG TB12 Take 1,200 mg by mouth daily as needed (congestion).     metoprolol tartrate (LOPRESSOR) 25 MG tablet Take 0.5 tablets (12.5 mg total) by mouth 2 (two) times daily. 30  tablet 1   omeprazole (PRILOSEC) 40 MG capsule Take 40 mg by mouth daily.     pantoprazole (PROTONIX) 40 MG tablet Take 40 mg by mouth daily.     traMADol (ULTRAM) 50 MG tablet Take 1 tablet (50 mg total) by mouth every 6 (six) hours as needed (1-2 tabs q 6 hours prn pain). 28 tablet 0   warfarin (COUMADIN) 5 MG tablet Take 1.5 tablets (7.5 mg total) by mouth daily at 6 PM. As directed by the coumadin clinic 100 tablet 1   No current facility-administered medications on file prior to visit.     Allergies  Allergen Reactions   Oxycodone Other (See Comments)    Vertigo,Hallucinations    Blood pressure 122/80, pulse (!) 103, temperature 97.7 F (36.5 C), temperature source Skin, resp. rate 20, height 6\' 3"  (1.905 m), weight 93 kg, SpO2 97 %.  Subjective Nathan [name] presents today for his routine 2-week follow-up appointment status post minimally invasive mitral valve repair with Dr. Tildon Husky.  He is still occasionally having some shortness of breath especially while wearing his mask.  He states that it has gotten significantly better since discharge from the hospital.  He still walking several times a day and using his incentive spirometer.  Objective   Cor:  Regular rate and rhythm, no murmur Pulm: Clear to auscultation bilaterally.  Some diminished breath sounds in the right lower lobe. ABD: No tenderness Wound: Right thoracotomy incision is well-healed.  Ecchymosis is apparent along the right thoracotomy incision to the sternum. EXT: No edema  CLINICAL DATA:  Status post minimally invasive mitral valve repair on 06/24/2019.  EXAM: CHEST - 2 VIEW  COMPARISON:  06/29/2019  FINDINGS: Normal sized heart. Tortuous aorta. Resolved left pleural fluid. No significant change in a small right pleural effusion and associated mild right basilar atelectasis. Stable post mitral valve annuloplasty changes. Unremarkable bones.  IMPRESSION: 1. Stable small right pleural effusion and mild  right basilar atelectasis. 2. Resolved left pleural fluid.   Electronically Signed   By: Beckie SaltsSteven  Reid M.D.   On: 07/06/2019 14:10  Assessment & Plan   Nathan [name] is a 58 year old male patient status post minimally invasive mitral valve repair with Dr. Cornelius Moraswen.  He had unremarkable hospitalization, in fact the patient was joking that everyone in the hospital was calling him superman because of how much she was walking around the unit.  He still does occasionally get short of breath but that has been improving with time.  I reviewed the patient's chest x-ray with him and his wife at the bedside.  He does have a small right pleural effusion but no other significant findings.  I do not believe that his effusion is enough to require a thoracentesis.  The patient does not have any bilateral pedal edema.  I stated that likely at this time the body will reabsorb that small amount of fluid that is present.  I encouraged him to utilize his incentive spirometer a few times a day and to continue doing his walking.  He states that he has been hitting over 2000 on his incentive spirometer and is no longer taking any pain medication.  I did clear him for driving short distances with his wife the first few times.  He was asking when he may return to work.  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  I advised him to wait at least a few more weeks to return to work given his shortness of breath.  Once he is feeling better, I encouraged him to reach out to our office and we may be able to arrange clearance at that time.  In the meantime, I encouraged him to continue to increase his exercise regimen.  He may begin to increase his weight limitation by 2 pounds a week.  He has been doing range of motion exercises with his upper body and has been successful.  He is returning to the Coumadin clinic for titration of his INR.  His last INR was 1.3 which was  subtherapeutic.  Overall, he has been doing quite well and recovering appropriately.  I have scheduled an appointment with Dr. Cornelius Moraswen for 2 months.  Certainly if he becomes more short of breath or is concerned about that pleural effusion he is to call our office and we can bring him back earlier.  I have ordered a chest x-ray for his 3656-month follow-up with Dr. Cornelius Moraswen to check the status of the pleural effusion.  I do not anticipate that anything will need done but a follow-up film I felt was indicated.  He will continue to follow-up as well with his cardiologist.   No medication changes were made at this appointment.  No longer requiring narcotics  for pain control.  He may drive.  He is to continue a heart healthy diet and lifestyle.  I would hold off on returning to work for now and check in with our office in a few weeks.  Elgie Collard 07/06/2019

## 2019-07-06 NOTE — Patient Instructions (Signed)
Make every effort to stay physically active, get some type of exercise on a regular basis, and stick to a "heart healthy diet".  The long term benefits for regular exercise and a healthy diet are critically important to your overall health and wellbeing.  You are encouraged to enroll and participate in the outpatient cardiac rehab program beginning as soon as practical.  You may return to driving an automobile as long as you are no longer requiring oral narcotic pain relievers during the daytime.  It would be wise to start driving only short distances during the daylight and gradually increase from there as you feel comfortable.  Continue to use your incentive spirometer.  Continue walking several times a day.  Slowly you can increase your upper body weight limitation by about 2 pounds a week.

## 2019-07-08 ENCOUNTER — Other Ambulatory Visit: Payer: Self-pay

## 2019-07-08 ENCOUNTER — Ambulatory Visit (INDEPENDENT_AMBULATORY_CARE_PROVIDER_SITE_OTHER): Payer: PRIVATE HEALTH INSURANCE

## 2019-07-08 DIAGNOSIS — Z9889 Other specified postprocedural states: Secondary | ICD-10-CM | POA: Diagnosis not present

## 2019-07-08 DIAGNOSIS — Z5181 Encounter for therapeutic drug level monitoring: Secondary | ICD-10-CM

## 2019-07-08 LAB — POCT INR: INR: 1.4 — AB (ref 2.0–3.0)

## 2019-07-08 NOTE — Patient Instructions (Signed)
Description   Take 2 tablets today, then start taking 1.5 tablets daily except 1 tablet on Sundays and Thursdays. Recheck INR in 1 week. Coumadin Clinic [phone] Main [phone]

## 2019-07-08 NOTE — Progress Notes (Signed)
CARDIOLOGY OFFICE NOTE  Date:  07/13/2019    [name] Date of Birth: 1961-09-01 Medical Record #[id number]  PCP:  System, Pcp Not In  Cardiologist:  Katrinka Blazing   Chief Complaint  Patient presents with  . Follow-up    Post hospital visit - seen for Dr. Katrinka Blazing    History of Present Illness: Nathan [name] is a 58 y.o. male who presents today for a post hospital visit. Seen for Dr. Katrinka Blazing.   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   Seen last month by Dr. Katrinka Blazing after being referred for severe MR. Cardiac cath was arranged. He has had progressive SOB over the past several years with more recent decline in exercise tolerance. More than 6 months ago he was seen at his primary care physician's office by a provider who noted the presence of a systolic murmur on physical exam. The patient had an echocardiograms at [facility] which revealed mitral valve prolapse with severe mitral regurgitation and normal left ventricular systolic function. Catheterization revealed normal coronary artery anatomy with no significant coronary artery disease. There was at least moderate mitral regurgitation. Right heart pressures were normal. He was then referred to Dr. Cornelius Moras for surgical intervention for minimally invasive mitral valve repair. He had surgery on 06/24/2019.   Post op course was unremarkable. He remained in NSR but did have some 1st and 2nd degree AV block - required adjustment of his beta blocker. Coumadin was started.   The patient does not have symptoms concerning for COVID-19 infection (fever, chills, cough, or new shortness of breath).   Comes in today. Here with his wife. He feels like he is doing ok.  He notes he had lots of visual issues post op - was seeing "triple, double" along with blurred vision and having "red streaks" noted - this is all improving (he thinks since stopping narcotic) -  having about one "red streak" a day. Notes that for the past 3 days however, he has started having numbness on the tops of his toes - he thought his socks might be too tight or it was from sitting. He is walking daily and feels good with that. Feels good otherwise. Not short of breath. Minimal soreness with breathing. He has driven twice. Minimal soreness. Off narcotic - says that really messed him up and there was about 3 days he does not really remember. 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 He is almost 3 weeks out from his surgery. He is on his aspirin along with his coumadin. No bleeding.   INR is 1.6 today.   Past Medical History:  Diagnosis Date  . Allergic asthma   . Cough   . Epigastric pain   . Fast heart beat   . GERD (gastroesophageal reflux disease)   . Heartburn   . 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   . History of pulmonary embolus (PE)   . History of transesophageal echocardiography (TEE)    ABNORMAL  . Hx of colonic polyps   . Mitral valve regurgitation   . Murmur, cardiac   . Ringing in ear   . 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 FINGER SURGERY    . MITRAL VALVE REPAIR N/A 06/24/2019   Procedure:  MINIMALLY INVASIVE MITRAL VALVE REPAIR (MVR) with a 32 Mitral Memo 4D;  Surgeon: Rexene Alberts, MD;  Location: [place];  Service: Open Heart Surgery;  Laterality: N/A;  . RIGHT/LEFT HEART CATH AND CORONARY ANGIOGRAPHY N/A 06/17/2019   Procedure: RIGHT/LEFT HEART CATH AND CORONARY ANGIOGRAPHY;  Surgeon: Belva Crome, MD;  Location: [place] [facility];  Service: Cardiovascular;  Laterality: N/A;  . SHOULDER SURGERY Left   . TEE WITHOUT CARDIOVERSION N/A 06/24/2019   Procedure: TRANSESOPHAGEAL ECHOCARDIOGRAM (TEE);  Surgeon: Rexene Alberts, MD;  Location: [place];  Service: Open Heart Surgery;  Laterality: N/A;  .  VASECTOMY       Medications: Current Meds  Medication Sig  . aspirin EC 81 MG EC tablet Take 1 tablet (81 mg total) by mouth daily.  . budesonide (RHINOCORT AQUA) 32 MCG/ACT nasal spray Place 2 sprays into both nostrils daily.   . budesonide-formoterol (SYMBICORT) 80-4.5 MCG/ACT inhaler Inhale 1-2 puffs into the lungs See admin instructions. Inhale 2 puffs in the morning, may inhale 1 puff during the day as needed for shortness of breath  . ferrous [id number] C-folic acid (TRINSICON / FOLTRIN) capsule Take 1 capsule by mouth daily with breakfast.  . Guaifenesin (MUCINEX MAXIMUM STRENGTH) 1200 MG TB12 Take 1,200 mg by mouth daily as needed (congestion).  . metoprolol tartrate (LOPRESSOR) 25 MG tablet Take 0.5 tablets (12.5 mg total) by mouth 2 (two) times daily.  Marland Kitchen omeprazole (PRILOSEC) 40 MG capsule Take 40 mg by mouth daily.  . pantoprazole (PROTONIX) 40 MG tablet Take 40 mg by mouth daily.  . traMADol (ULTRAM) 50 MG tablet Take 1 tablet (50 mg total) by mouth every 6 (six) hours as needed (1-2 tabs q 6 hours prn pain).  Marland Kitchen warfarin (COUMADIN) 5 MG tablet Take 1.5 tablets (7.5 mg total) by mouth daily at 6 PM. As directed by the coumadin clinic     Allergies: Allergies  Allergen Reactions  . Oxycodone Other (See Comments)    Vertigo,Hallucinations    Social History: The patient  reports that he has never smoked. He has never used smokeless tobacco. He reports current alcohol use of about 6.0 standard drinks of alcohol per week. He reports that he does not use drugs.   Family History: The patient's family history includes Diabetes in his mother.   Review of Systems: Please see the history of present illness.   All other systems are reviewed and negative.   Physical Exam: VS:  BP 114/66   Pulse 92   Ht 6\' 3"  (1.905 m)   Wt 203 lb 1.9 oz (92.1 kg)   SpO2 99%   BMI 25.39 kg/m  .  BMI Body mass index is 25.39 kg/m.  Wt Readings from Last 3 Encounters:  07/13/19 203  lb 1.9 oz (92.1 kg)  07/06/19 205 lb (93 kg)  06/29/19 210 lb 6.4 oz (95.4 kg)    General: Pleasant. Well developed, well nourished and in no acute distress.   HEENT: Normal.  Neck: Supple, no JVD, carotid bruits, or masses noted.  Cardiac: Regular rate and rhythm. No murmurs, rubs, or gallops. No edema. Right chest thoracotomy incisions look ok - resolving bruising. He has 2+ pulses in both feet. Toes are of normal color.  Respiratory:  Lungs are clear to auscultation bilaterally with normal work of breathing.  GI: Soft and nontender.  MS: No deformity or atrophy. Gait and ROM intact.  Skin: Warm and dry. Color is normal.  Neuro:  Strength and  sensation are intact and no gross focal deficits noted.  Psych: Alert, appropriate and with normal affect.   LABORATORY DATA:  EKG:  EKG is ordered today. This demonstrates NSR - looks unchanged.  Lab Results  Component Value Date   WBC 14.0 (H) 06/27/2019   HGB 9.4 (L) 06/27/2019   HCT 27.9 (L) 06/27/2019   PLT 220 06/27/2019   GLUCOSE 120 (H) 06/29/2019   ALT 13 06/19/2019   AST 16 06/19/2019   NA 136 06/29/2019   K 3.6 06/29/2019   CL 101 06/29/2019   CREATININE 0.98 06/29/2019   BUN 15 06/29/2019   CO2 26 06/29/2019   INR 1.6 (A) 07/13/2019   HGBA1C 5.5 06/19/2019   Lab Results  Component Value Date   INR 1.6 (A) 07/13/2019   INR 1.4 (A) 07/08/2019   INR 1.3 (A) 07/02/2019     BNP (last 3 results) No results for input(s): BNP in the last 8760 hours.  ProBNP (last 3 results) Recent Labs    06/15/19 1232  PROBNP 39     Other Studies Reviewed Today:  Date of Procedure:06/24/2019  Procedure:  Minimally-Invasive Mitral Valve Repair Complex valvuloplasty including triangular resection of flail segment (P2) of posterior leaflet Artificial Gore-tex neochord placement x6 Sorin Memo 4D Ring Annuloplasty (size 32mm, catalog #[id number], serial #[id number])    RIGHT/LEFT HEART CATH AND CORONARY ANGIOGRAPHY 05/2019  Conclusion   Mild to moderate mitral regurgitation by left ventriculography.  Normal pulmonary artery hemodynamics including wedge pressure without significant V wave.  Normal right dominant coronary anatomy.  Normal coronary arteries.  Normal LV function with EF at least 60%.  LVEDP is normal.  RECOMMENDATIONS:   The patient does have significantly exertional dyspnea.  Dyspnea seems out of proportion to resting hemodynamics.  Recent transesophageal echo revealed P2 prolapse with moderate to severe mitral regurgitation [facility]).  Plan to refer patient to Dr. Tressie Stalker for further evaluation.  May need pulmonary evaluation.     Assessment/Plan:  1. Post op MV repair - doing well - on coumadin and aspirin - INR and labs today. I think its too early to return to work - heavy lifting is involved with his job - hopefully in the next few weeks. I do not think shooting a gun - he is right handed - will feel all that good either at this time. Will check lab today. Get echo updated. Will see back in 3 weeks. I am not sure what to make of his toes being numb - he has excellent distal pulses and normal color noted.   2. Post op blood loss anemia - rechecking lab today.   3. 1st and 2nd degree AV block - in NSR on EKG today.   4. Notation of PE - he tells me this happened several years ago - he did several months of Xarelto - unclear etiology and work up.   5. History of hepatitis - most recent LFTs ok. This was assumed to have been from eating raw fish.   6. Former shortness of breath - had been treated for years for "asthma" - most likely this was all related to his valve - this is now resolved.   7. COVID-19 Education: The signs and symptoms of COVID-19 were discussed with the patient and how to seek care for testing (follow up with PCP or arrange E-visit).  The importance of social distancing,  staying at home, hand hygiene and wearing a mask when out in public were discussed today.  Current medicines are reviewed with the patient today.  The patient does not have concerns regarding medicines other than what has been noted above.  The following changes have been made:  See above.  Labs/ tests ordered today include:    Orders Placed This Encounter  Procedures  . Basic metabolic panel  . CBC  . ECHOCARDIOGRAM COMPLETE     Disposition:   FU with us in about 3 weeks. Lab, INR and echo to be arranged.   Patient is agreeable to this plan and will call if any problems develop in the interim.   SignedNorma Fredrickson: Jovanka Westgate, NP  07/13/2019 12:30 PM  [facility] [address] [place], [place]  [address] Phone: [phone] Fax: [phone]

## 2019-07-13 ENCOUNTER — Other Ambulatory Visit: Payer: Self-pay

## 2019-07-13 ENCOUNTER — Encounter: Payer: Self-pay | Admitting: Nurse Practitioner

## 2019-07-13 ENCOUNTER — Ambulatory Visit (INDEPENDENT_AMBULATORY_CARE_PROVIDER_SITE_OTHER): Payer: PRIVATE HEALTH INSURANCE | Admitting: Nurse Practitioner

## 2019-07-13 ENCOUNTER — Ambulatory Visit (INDEPENDENT_AMBULATORY_CARE_PROVIDER_SITE_OTHER): Payer: PRIVATE HEALTH INSURANCE | Admitting: *Deleted

## 2019-07-13 VITALS — BP 114/66 | HR 92 | Ht 75.0 in | Wt 203.1 lb

## 2019-07-13 DIAGNOSIS — Z9889 Other specified postprocedural states: Secondary | ICD-10-CM

## 2019-07-13 DIAGNOSIS — Z5181 Encounter for therapeutic drug level monitoring: Secondary | ICD-10-CM | POA: Diagnosis not present

## 2019-07-13 DIAGNOSIS — Z7189 Other specified counseling: Secondary | ICD-10-CM

## 2019-07-13 DIAGNOSIS — Z7901 Long term (current) use of anticoagulants: Secondary | ICD-10-CM

## 2019-07-13 LAB — POCT INR: INR: 1.6 — AB (ref 2.0–3.0)

## 2019-07-13 NOTE — Patient Instructions (Addendum)
After Visit Summary:  We will be checking the following labs today - BMET and CBC   Medication Instructions:    Continue with your current medicines.    If you need a refill on your cardiac medications before your next appointment, please call your pharmacy.     Testing/Procedures To Be Arranged:  Echocardiogram  Follow-Up:   See me or Dr. Tamala Julian in 3 months    At [facility], you and your health needs are our priority.  As part of our continuing mission to provide you with exceptional heart care, we have created designated Provider Care Teams.  These Care Teams include your primary Cardiologist (physician) and Advanced Practice Providers (APPs -  Physician Assistants and Nurse Practitioners) who all work together to provide you with the care you need, when you need it.  Special Instructions:  . Stay safe, stay home, wash your hands for at least 20 seconds and wear a mask when out in public.  . It was good to talk with you today.    Call the [place] office at [phone] if you have any questions, problems or concerns.

## 2019-07-13 NOTE — Patient Instructions (Signed)
Description   Take 2 tablets today, then start taking 1.5 tablets daily. Recheck INR in 1 week. Coumadin Clinic [phone] Main [phone]

## 2019-07-14 LAB — BASIC METABOLIC PANEL
BUN/Creatinine Ratio: 18 (ref 9–20)
BUN: 17 mg/dL (ref 6–24)
CO2: 22 mmol/L (ref 20–29)
Calcium: 9.4 mg/dL (ref 8.7–10.2)
Chloride: 101 mmol/L (ref 96–106)
Creatinine, Ser: 0.96 mg/dL (ref 0.76–1.27)
GFR calc Af Amer: 100 mL/min/{1.73_m2} (ref >59)
GFR calc non Af Amer: 87 mL/min/{1.73_m2} (ref >59)
Glucose: 83 mg/dL (ref 65–99)
Potassium: 4.8 mmol/L (ref 3.5–5.2)
Sodium: 141 mmol/L (ref 134–144)

## 2019-07-14 LAB — CBC
Hematocrit: 34.3 % — ABNORMAL LOW (ref 37.5–51.0)
Hemoglobin: 11.2 g/dL — ABNORMAL LOW (ref 13.0–17.7)
MCH: 26.5 pg — ABNORMAL LOW (ref 26.6–33.0)
MCHC: 32.7 g/dL (ref 31.5–35.7)
MCV: 81 fL (ref 79–97)
Platelets: 351 10*3/uL (ref 150–450)
RBC: 4.22 x10E6/uL (ref 4.14–5.80)
RDW: 13.9 % (ref 11.6–15.4)
WBC: 10.6 10*3/uL (ref 3.4–10.8)

## 2019-07-14 NOTE — Addendum Note (Signed)
Addended by: Mendel Ryder on: 07/14/2019 10:18 AM   Modules accepted: Orders

## 2019-07-20 ENCOUNTER — Ambulatory Visit (INDEPENDENT_AMBULATORY_CARE_PROVIDER_SITE_OTHER): Payer: PRIVATE HEALTH INSURANCE | Admitting: Pharmacist

## 2019-07-20 ENCOUNTER — Other Ambulatory Visit: Payer: Self-pay

## 2019-07-20 DIAGNOSIS — Z9889 Other specified postprocedural states: Secondary | ICD-10-CM

## 2019-07-20 DIAGNOSIS — Z5181 Encounter for therapeutic drug level monitoring: Secondary | ICD-10-CM

## 2019-07-20 LAB — POCT INR: INR: 1.9 — AB (ref 2.0–3.0)

## 2019-07-20 NOTE — Patient Instructions (Signed)
Description   Take 2 tablets today, then start taking 1.5 tablets daily except for 2 tablets on thursdays. Recheck INR in 1 week. Coumadin Clinic [phone] Main [phone]

## 2019-07-22 ENCOUNTER — Ambulatory Visit: Payer: PRIVATE HEALTH INSURANCE | Admitting: Nurse Practitioner

## 2019-07-27 ENCOUNTER — Ambulatory Visit (INDEPENDENT_AMBULATORY_CARE_PROVIDER_SITE_OTHER): Payer: PRIVATE HEALTH INSURANCE

## 2019-07-27 ENCOUNTER — Other Ambulatory Visit: Payer: Self-pay

## 2019-07-27 DIAGNOSIS — Z5181 Encounter for therapeutic drug level monitoring: Secondary | ICD-10-CM | POA: Diagnosis not present

## 2019-07-27 DIAGNOSIS — Z9889 Other specified postprocedural states: Secondary | ICD-10-CM | POA: Diagnosis not present

## 2019-07-27 LAB — POCT INR: INR: 2.6 (ref 2.0–3.0)

## 2019-07-27 NOTE — Patient Instructions (Signed)
Description   Continue on same dosage 1.5 tablets daily except for 2 tablets on Thursdays. Recheck INR in 10 days. [facility]madin Clinic [phone] Main [phone]

## 2019-07-29 ENCOUNTER — Other Ambulatory Visit: Payer: Self-pay | Admitting: Nurse Practitioner

## 2019-07-29 MED ORDER — METOPROLOL TARTRATE 25 MG PO TABS: 12.5000 mg | ORAL_TABLET | Freq: Two times a day (BID) | ORAL | 1 refills | Status: DC

## 2019-07-29 NOTE — Telephone Encounter (Signed)
Pt's medication was sent to pt's pharmacy as requested. Confirmation received.  °

## 2019-08-03 ENCOUNTER — Telehealth: Payer: Self-pay | Admitting: Interventional Cardiology

## 2019-08-03 NOTE — Telephone Encounter (Signed)
New message    Pt c/o medication issue:  1. Name of Medication: Metoprolol 2. How are you currently taking this medication (dosage and times per day)? As written  3. Are you having a reaction (difficulty breathing--STAT)? no  4. What is your medication issue? Spouse calling, wants to know if patient should continue taking medication. Is Metoprolol a maintenance drug for him

## 2019-08-03 NOTE — Telephone Encounter (Signed)
He should continue his medicines until instructed to discontinue.   Burtis Junes, RN, [place] [address] [place], [place]  [phone]

## 2019-08-03 NOTE — Telephone Encounter (Signed)
lvm for pt to call back to answer question.

## 2019-08-03 NOTE — Telephone Encounter (Signed)
Pt is to stay on Toprol until provider states to D/C.

## 2019-08-06 ENCOUNTER — Ambulatory Visit (INDEPENDENT_AMBULATORY_CARE_PROVIDER_SITE_OTHER): Payer: PRIVATE HEALTH INSURANCE | Admitting: *Deleted

## 2019-08-06 ENCOUNTER — Other Ambulatory Visit: Payer: Self-pay

## 2019-08-06 DIAGNOSIS — Z9889 Other specified postprocedural states: Secondary | ICD-10-CM

## 2019-08-06 DIAGNOSIS — Z5181 Encounter for therapeutic drug level monitoring: Secondary | ICD-10-CM

## 2019-08-06 LAB — POCT INR: INR: 2 (ref 2.0–3.0)

## 2019-08-06 NOTE — Patient Instructions (Signed)
Description   Continue on same dosage 1.5 tablets daily except for 2 tablets on Thursdays. Recheck INR in 2 weeks [facility] [phone] Main [phone]

## 2019-08-10 ENCOUNTER — Other Ambulatory Visit: Payer: Self-pay | Admitting: Thoracic Surgery (Cardiothoracic Vascular Surgery)

## 2019-08-10 DIAGNOSIS — Z9889 Other specified postprocedural states: Secondary | ICD-10-CM

## 2019-08-17 ENCOUNTER — Ambulatory Visit: Payer: Self-pay | Admitting: Thoracic Surgery (Cardiothoracic Vascular Surgery)

## 2019-08-20 ENCOUNTER — Ambulatory Visit (INDEPENDENT_AMBULATORY_CARE_PROVIDER_SITE_OTHER): Payer: PRIVATE HEALTH INSURANCE | Admitting: *Deleted

## 2019-08-20 ENCOUNTER — Other Ambulatory Visit: Payer: Self-pay

## 2019-08-20 ENCOUNTER — Other Ambulatory Visit: Payer: Self-pay | Admitting: Surgical

## 2019-08-20 ENCOUNTER — Encounter (INDEPENDENT_AMBULATORY_CARE_PROVIDER_SITE_OTHER): Payer: Self-pay

## 2019-08-20 DIAGNOSIS — Z5181 Encounter for therapeutic drug level monitoring: Secondary | ICD-10-CM | POA: Diagnosis not present

## 2019-08-20 DIAGNOSIS — Z9889 Other specified postprocedural states: Secondary | ICD-10-CM

## 2019-08-20 LAB — POCT INR: INR: 1.9 — AB (ref 2.0–3.0)

## 2019-08-20 MED ORDER — WARFARIN SODIUM 5 MG PO TABS: ORAL_TABLET | ORAL | 1 refills | Status: DC

## 2019-08-20 NOTE — Patient Instructions (Signed)
Description   Take 2.5 tablets today, then start taking 1.5 tablets daily except for 2 tablets on  Sundays and Thursdays. Recheck INR in 2 weeks [facility] [phone] Main [phone]

## 2019-08-31 ENCOUNTER — Other Ambulatory Visit: Payer: Self-pay | Admitting: Thoracic Surgery (Cardiothoracic Vascular Surgery)

## 2019-08-31 ENCOUNTER — Ambulatory Visit: Payer: PRIVATE HEALTH INSURANCE | Admitting: Thoracic Surgery (Cardiothoracic Vascular Surgery)

## 2019-08-31 ENCOUNTER — Other Ambulatory Visit (HOSPITAL_COMMUNITY): Payer: PRIVATE HEALTH INSURANCE

## 2019-08-31 ENCOUNTER — Ambulatory Visit: Payer: Self-pay | Admitting: Thoracic Surgery (Cardiothoracic Vascular Surgery)

## 2019-08-31 ENCOUNTER — Ambulatory Visit
Admission: RE | Admit: 2019-08-31 | Discharge: 2019-08-31 | Disposition: A | Discharge status: Home / Self Care | Payer: PRIVATE HEALTH INSURANCE | Source: Ambulatory Visit | Attending: Thoracic Surgery (Cardiothoracic Vascular Surgery) | Admitting: Thoracic Surgery (Cardiothoracic Vascular Surgery)

## 2019-08-31 DIAGNOSIS — Z9889 Other specified postprocedural states: Secondary | ICD-10-CM

## 2019-09-01 ENCOUNTER — Other Ambulatory Visit: Payer: Self-pay

## 2019-09-01 ENCOUNTER — Encounter (HOSPITAL_COMMUNITY): Payer: Self-pay

## 2019-09-01 ENCOUNTER — Ambulatory Visit (HOSPITAL_COMMUNITY): Payer: PRIVATE HEALTH INSURANCE | Attending: Nurse Practitioner

## 2019-09-01 ENCOUNTER — Telehealth (INDEPENDENT_AMBULATORY_CARE_PROVIDER_SITE_OTHER): Payer: Self-pay | Admitting: Thoracic Surgery (Cardiothoracic Vascular Surgery)

## 2019-09-01 ENCOUNTER — Telehealth (INDEPENDENT_AMBULATORY_CARE_PROVIDER_SITE_OTHER): Payer: PRIVATE HEALTH INSURANCE | Admitting: *Deleted

## 2019-09-01 DIAGNOSIS — Z9889 Other specified postprocedural states: Secondary | ICD-10-CM | POA: Insufficient documentation

## 2019-09-01 DIAGNOSIS — Z954 Presence of other heart-valve replacement: Secondary | ICD-10-CM | POA: Diagnosis not present

## 2019-09-01 NOTE — Patient Instructions (Addendum)
Continue all previous medications without any changes at this time, although it may be reasonable to stop taking both Coumadin and metoprolol pending results from your office visit with Dr. Tamala Julian later today  You may resume unrestricted physical activity without any particular limitations at this time.  Endocarditis is a potentially serious infection of heart valves or inside lining of the heart.  It occurs more commonly in patients with diseased heart valves (such as patient's with aortic or mitral valve disease) and in patients who have undergone heart valve repair or replacement.  Certain surgical and dental procedures may put you at risk, such as dental cleaning, other dental procedures, or any surgery involving the respiratory, urinary, gastrointestinal tract, gallbladder or prostate gland.   To minimize your chances for develooping endocarditis, maintain good oral health and seek prompt medical attention for any infections involving the mouth, teeth, gums, skin or urinary tract.    Always notify your doctor or dentist about your underlying heart valve condition before having any invasive procedures. You will need to take antibiotics before certain procedures, including all routine dental cleanings or other dental procedures.  Your cardiologist or dentist should prescribe these antibiotics for you to be taken ahead of time.

## 2019-09-01 NOTE — Telephone Encounter (Signed)
Pt in today for echo.  Triage was contacted about doing an EKG on pt per Dr. Guy Sandifer note.  EKG completed.  DOD, Dr. Curt Bears, reviewed and pt noted to be in NSR.  Will route to Dr. Roxy Manns to make him aware.

## 2019-09-01 NOTE — Progress Notes (Signed)
Nathan [name] presented for echocardiogram today referred by Truitt Merle. Echo showed depressed EF and possible EKG change. DOD (Dr. Curt Bears) was notified and EKG is being performed. Patient is stable to be discharged.  Wyatt Mage, RDCS

## 2019-09-01 NOTE — Progress Notes (Signed)
[address]       [place] [address]             [phone]     CARDIOTHORACIC SURGERY TELEPHONE VIRTUAL OFFICE NOTE  Referring Provider is Belva Crome, MD Primary Cardiologist is No primary care provider on file. PCP is System, Pcp Not In   HPI:  I spoke with Nathan [name] (DOB [birth year] ) via telephone on 09/01/2019 at 12:22 PM and verified that I was speaking with the correct person using more than one form of identification.  We discussed the reason(s) for conducting our visit virtually instead of in-person.  The patient expressed understanding the circumstances and agreed to proceed as described.   Patient is a 58 year old male who underwent minimally invasive mitral valve repair for mitral valve prolapse with severe symptomatic primary mitral regurgitation on June 24, 2019.  His early postoperative recovery in the hospital was uneventful and he was discharged home on the fifth postoperative day.  He was last seen here in our office on July 06, 2019 at which time he was making fairly good progress with his recovery.  He was seen by Ludwig Lean on July 13, 2019 and is scheduled for routine follow-up with transthoracic echocardiogram at Dr. Thompson Caul office later today.  I spoke with the patient over the telephone this afternoon to see how he is progressing.  He reports that overall he is doing very well.  He states that he still gets some shortness of breath with more strenuous exertion but he has been careful not to push himself too much physically.  He states that occasionally he will get mild dizziness when he first stands up after having been sitting down or lying down for prolonged period of time.  He has not had any severe dizziness nor syncope.  He no longer has significant pain in his chest.  Appetite is good.  He is sleeping well.  He complains that he does have some mild numbness in the toes of both feet.   Current Outpatient Medications   Medication Sig Dispense Refill  . aspirin EC 81 MG EC tablet Take 1 tablet (81 mg total) by mouth daily.    . budesonide (RHINOCORT AQUA) 32 MCG/ACT nasal spray Place 2 sprays into both nostrils daily.     . budesonide-formoterol (SYMBICORT) 80-4.5 MCG/ACT inhaler Inhale 1-2 puffs into the lungs See admin instructions. Inhale 2 puffs in the morning, may inhale 1 puff during the day as needed for shortness of breath    . ferrous [id number] C-folic acid (TRINSICON / FOLTRIN) capsule Take 1 capsule by mouth daily with breakfast. 30 capsule 1  . Guaifenesin (MUCINEX MAXIMUM STRENGTH) 1200 MG TB12 Take 1,200 mg by mouth daily as needed (congestion).    . metoprolol tartrate (LOPRESSOR) 25 MG tablet Take 0.5 tablets (12.5 mg total) by mouth 2 (two) times daily. 30 tablet 1  . omeprazole (PRILOSEC) 40 MG capsule Take 40 mg by mouth daily.    . pantoprazole (PROTONIX) 40 MG tablet Take 40 mg by mouth daily.    . traMADol (ULTRAM) 50 MG tablet Take 1 tablet (50 mg total) by mouth every 6 (six) hours as needed (1-2 tabs q 6 hours prn pain). 28 tablet 0  . warfarin (COUMADIN) 5 MG tablet Take as directed by the coumadin clinic 60 tablet 1   No current facility-administered medications for this visit.     Diagnostic Tests:  CHEST - 2 VIEW  COMPARISON:  07/06/2019 chest radiograph.  FINDINGS: Mitral valve prosthesis in place. Stable cardiomediastinal silhouette with normal heart size. No pneumothorax. No pleural effusion. Minimal curvilinear scarring at the right lung base. Otherwise clear lungs with no pulmonary edema.  IMPRESSION: Minimal right lung base scarring. Otherwise no active cardiopulmonary disease. Resolved pleural effusions.   Electronically Signed   By: Delbert Phenix M.D.   On: 08/31/2019 09:22   Impression:  Patient sounds to be doing very well clinically approximately 3 months status post minimally invasive mitral valve repair for mitral valve prolapse with  severe symptomatic primary mitral regurgitation.   Plan:  We have not made any specific recommendations regarding changes to the patient's medications pending findings from routine follow-up EKG and echocardiogram scheduled to be performed at Dr. Michaelle Copas office later today.  Presuming the patient remains in sinus rhythm I think it would be reasonable to stop Coumadin and possibly stop metoprolol as well.  I have encouraged the patient to continue to gradually increase his physical activity without any particular limitations.  The patient has been reminded regarding the importance of dental hygiene and the lifelong need for antibiotic prophylaxis for all dental cleanings and other related invasive procedures.  All of his questions have been addressed.  We will review the results of the patient's follow-up echocardiogram as soon as it is available.  The patient will continue to follow-up periodically with Dr. Katrinka Blazing.  We will plan to have the patient return for routine follow-up next fall, approximately 1 year following his surgery.  He will call and return sooner should specific problems or questions arise.    I discussed limitations of evaluation and management via telephone.  The patient was advised to call back for repeat telephone consultation or to seek an in-person evaluation if questions arise or the patient's clinical condition changes in any significant manner.     Salvatore Decent. Cornelius Moras, MD 09/01/2019 12:22 PM

## 2019-09-14 ENCOUNTER — Telehealth: Payer: Self-pay | Admitting: Thoracic Surgery (Cardiothoracic Vascular Surgery)

## 2019-09-21 NOTE — Addendum Note (Signed)
Addended by: Julio Sicks on: 09/21/2019 08:04 AM   Modules accepted: Orders

## 2019-10-08 NOTE — Progress Notes (Signed)
Cardiology Office Note:    Date:  10/13/2019   ID:  Nathan [name], DOB 1961-04-17, MRN [id number]  PCP:  System, Pcp Not In  Cardiologist:  Lesleigh Noe, MD   Referring MD: No ref. provider found   Chief Complaint  Patient presents with  . Cardiac Valve Problem    Mitral valve repair    History of Present Illness:    Nathan [name] is a 59 y.o. male with a hx of documented moderate to severe mitral regurgitation, minimally invasive MV repair 06/24/19, prior PE, who returns for f/u and to discuss duration of anticoagulation.   He is doing okay.  He wants to be off of all medications.  Not his mitral valve has been repaired, he feels no better than he did prior.  His concern right along his been shortness of breath.  He has a diagnosis of asthma but states that management has never led to improvement.  He denies palpitations.  He has discontinued warfarin and all other medications (was discharged on low-dose beta-blocker therapy).  Past Medical History:  Diagnosis Date  . Allergic asthma   . Cough   . Epigastric pain   . Fast heart beat   . GERD (gastroesophageal reflux disease)   . Heartburn   . 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   . History of pulmonary embolus (PE)   . History of transesophageal echocardiography (TEE)    ABNORMAL  . Hx of colonic polyps   . Mitral valve regurgitation   . Murmur, cardiac   . Ringing in ear   . 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 FINGER SURGERY    . MITRAL VALVE REPAIR N/A 06/24/2019   Procedure: MINIMALLY INVASIVE MITRAL VALVE REPAIR (MVR) with a 32 Mitral Memo 4D;  Surgeon: Purcell Nails, MD;  Location: [facility] OR;  Service: Open Heart Surgery;  Laterality: N/A;  . RIGHT/LEFT HEART CATH AND CORONARY ANGIOGRAPHY N/A 06/17/2019   Procedure: RIGHT/LEFT HEART CATH AND CORONARY ANGIOGRAPHY;  Surgeon: Lyn Records, MD;  Location:  [facility];  Service: Cardiovascular;  Laterality: N/A;  . SHOULDER SURGERY Left   . TEE WITHOUT CARDIOVERSION N/A 06/24/2019   Procedure: TRANSESOPHAGEAL ECHOCARDIOGRAM (TEE);  Surgeon: Purcell Nails, MD;  Location: [facility] OR;  Service: Open Heart Surgery;  Laterality: N/A;  . VASECTOMY      Current Medications: Current Meds  Medication Sig  . budesonide (RHINOCORT AQUA) 32 MCG/ACT nasal spray Place 2 sprays into both nostrils daily.   . budesonide-formoterol (SYMBICORT) 80-4.5 MCG/ACT inhaler Inhale 1-2 puffs into the lungs See admin instructions. Inhale 2 puffs in the morning, may inhale 1 puff during the day as needed for shortness of breath  . ferrous fumarate-b12-vitamic C-folic acid (TRINSICON / FOLTRIN) capsule Take 1 capsule by mouth daily with breakfast.     Allergies:   Oxycodone   Social History   Socioeconomic History  . Marital status: Married    Spouse name: Not on file  . Number of children: Not on file  . Years of education: Not on file  . Highest education level: Not on file  Occupational History  . Not on file  Tobacco Use  . Smoking status: Never Smoker  . Smokeless tobacco: Never Used  Substance and Sexual Activity  . Alcohol use: Yes    Alcohol/week: 6.0 standard drinks  Types: 6 Glasses of wine per week  . Drug use: Never  . Sexual activity: Not on file    Comment: MARRIED  Other Topics Concern  . Not on file  Social History Narrative  . Not on file   Social Determinants of Health   Financial Resource Strain:   . Difficulty of Paying Living Expenses: Not on file  Food Insecurity:   . Worried About Charity fundraiser in the Last Year: Not on file  . Ran Out of Food in the Last Year: Not on file  Transportation Needs:   . Lack of Transportation (Medical): Not on file  . Lack of Transportation (Non-Medical): Not on file  Physical Activity:   . Days of Exercise per Week: Not on file  . Minutes of Exercise per Session: Not on file    Stress:   . Feeling of Stress : Not on file  Social Connections:   . Frequency of Communication with Friends and Family: Not on file  . Frequency of Social Gatherings with Friends and Family: Not on file  . Attends Religious Services: Not on file  . Active Member of Clubs or Organizations: Not on file  . Attends Archivist Meetings: Not on file  . Marital Status: Not on file     Family History: The patient's family history includes Diabetes in his mother.  ROS:   Please see the history of present illness.    Drinks alcohol.  Asked him to cut back.  All other systems reviewed and are negative.  EKGs/Labs/Other Studies Reviewed:    The following studies were reviewed today: No new data  EKG:  EKG not performed  Recent Labs: 06/15/2019: NT-Pro BNP 39 06/19/2019: ALT 13 06/25/2019: Magnesium 2.2 07/13/2019: BUN 17; Creatinine, Ser 0.96; Hemoglobin 11.2; Platelets 351; Potassium 4.8; Sodium 141  Recent Lipid Panel No results found for: CHOL, TRIG, HDL, CHOLHDL, VLDL, LDLCALC, LDLDIRECT  Physical Exam:    VS:  BP (!) 144/90   Pulse 82   Ht 6\' 3"  (1.905 m)   Wt 219 lb 12.8 oz (99.7 kg)   SpO2 97%   BMI 27.47 kg/m     Wt Readings from Last 3 Encounters:  10/13/19 219 lb 12.8 oz (99.7 kg)  07/13/19 203 lb 1.9 oz (92.1 kg)  07/06/19 205 lb (93 kg)     GEN: Tall and compatible with age in appearance. No acute distress HEENT: Normal NECK: No JVD. LYMPHATICS: No lymphadenopathy CARDIAC:  RRR without murmur, gallop, or edema. VASCULAR:   Normal Pulses. No bruits. RESPIRATORY:  Wheezing on expiration bilateral lower lobes left greater than right. ABDOMEN: Soft, non-tender, non-distended, No pulsatile mass, MUSCULOSKELETAL: No deformity  SKIN: Warm and dry NEUROLOGIC:  Alert and oriented x 3 PSYCHIATRIC:  Normal affect   ASSESSMENT:    1. S/P minimally invasive mitral valve repair   2. Essential hypertension   3. Chronic anticoagulation   4. Mild persistent  reactive airway disease without complication   5. Educated about COVID-19 virus infection    PLAN:    In order of problems listed above:  1. Valve is doing well based on clinical exam. 2. Elevated and there is probably an underlying diagnosis of essential hypertension.  We will start amlodipine 2.5 mg/day and increase to 5 mg if needed.  Encourage aerobic activity to improve exertional tolerance.  Decrease alcohol in diet.  Clinical follow-up in 3 months with me or Remer Macho. 3. Anticoagulation has been discontinued 4. Continues  to wheeze.  I recommended that he get back in with his pulmonologist.  We have now removed mitral regurgitation as a potential cause for shortness of breath and functionally he is no better than prior to procedure. 5. 3W's and COVID-19 vaccine were advocated  Clinical follow-up with Norma Fredrickson or myself in 3 months.  If blood pressure is markedly improved it would be okay to try him off of amlodipine.  If he is not controlled below 130/80 mmHg, we should continue amlodipine and perhaps increase to 5 mg/day.   Medication Adjustments/Labs and Tests Ordered: Current medicines are reviewed at length with the patient today.  Concerns regarding medicines are outlined above.  No orders of the defined types were placed in this encounter.  Meds ordered this encounter  Medications  . amLODipine (NORVASC) 2.5 MG tablet    Sig: Take 1 tablet (2.5 mg total) by mouth daily.    Dispense:  90 tablet    Refill:  3    Patient Instructions  Medication Instructions:  1) START Amlodipine 2.5mg  once daily  *If you need a refill on your cardiac medications before your next appointment, please call your pharmacy*  Lab Work: None If you have labs (blood work) drawn today and your tests are completely normal, you will receive your results only by: Marland Kitchen MyChart Message (if you have MyChart) OR . A paper copy in the mail If you have any lab test that is abnormal or we need to  change your treatment, we will call you to review the results.  Testing/Procedures: None  Follow-Up: At [facility], you and your health needs are our priority.  As part of our continuing mission to provide you with exceptional heart care, we have created designated Provider Care Teams.  These Care Teams include your primary Cardiologist (physician) and Advanced Practice Providers (APPs -  Physician Assistants and Nurse Practitioners) who all work together to provide you with the care you need, when you need it.  Your next appointment:   3 month(s)  The format for your next appointment:   In Person  Provider:   You may see Lesleigh Noe, MD or one of the following Advanced Practice Providers on your designated Care Team:    Norma Fredrickson, NP  Nada Boozer, NP  Georgie Chard, NP   Other Instructions      Signed, Lesleigh Noe, MD  10/13/2019 10:03 AM    [facility]

## 2019-10-13 ENCOUNTER — Other Ambulatory Visit: Payer: Self-pay

## 2019-10-13 ENCOUNTER — Encounter: Payer: Self-pay | Admitting: Interventional Cardiology

## 2019-10-13 ENCOUNTER — Encounter (INDEPENDENT_AMBULATORY_CARE_PROVIDER_SITE_OTHER): Payer: Self-pay

## 2019-10-13 ENCOUNTER — Ambulatory Visit (INDEPENDENT_AMBULATORY_CARE_PROVIDER_SITE_OTHER): Payer: PRIVATE HEALTH INSURANCE | Admitting: Interventional Cardiology

## 2019-10-13 VITALS — BP 144/90 | HR 82 | Ht 75.0 in | Wt 219.8 lb

## 2019-10-13 DIAGNOSIS — Z7189 Other specified counseling: Secondary | ICD-10-CM

## 2019-10-13 DIAGNOSIS — J4530 Mild persistent asthma, uncomplicated: Secondary | ICD-10-CM

## 2019-10-13 DIAGNOSIS — Z7901 Long term (current) use of anticoagulants: Secondary | ICD-10-CM | POA: Diagnosis not present

## 2019-10-13 DIAGNOSIS — I10 Essential (primary) hypertension: Secondary | ICD-10-CM | POA: Diagnosis not present

## 2019-10-13 DIAGNOSIS — Z9889 Other specified postprocedural states: Secondary | ICD-10-CM | POA: Diagnosis not present

## 2019-10-13 MED ORDER — AMLODIPINE BESYLATE 2.5 MG PO TABS: 2.5000 mg | ORAL_TABLET | Freq: Every day | ORAL | 3 refills | Status: DC

## 2019-10-13 NOTE — Patient Instructions (Signed)
Medication Instructions:  1) START Amlodipine 2.5mg  once daily  *If you need a refill on your cardiac medications before your next appointment, please call your pharmacy*  Lab Work: None If you have labs (blood work) drawn today and your tests are completely normal, you will receive your results only by: Marland Kitchen MyChart Message (if you have MyChart) OR . A paper copy in the mail If you have any lab test that is abnormal or we need to change your treatment, we will call you to review the results.  Testing/Procedures: None  Follow-Up: At [facility], you and your health needs are our priority.  As part of our continuing mission to provide you with exceptional heart care, we have created designated Provider Care Teams.  These Care Teams include your primary Cardiologist (physician) and Advanced Practice Providers (APPs -  Physician Assistants and Nurse Practitioners) who all work together to provide you with the care you need, when you need it.  Your next appointment:   3 month(s)  The format for your next appointment:   In Person  Provider:   You may see Lesleigh Noe, MD or one of the following Advanced Practice Providers on your designated Care Team:    Norma Fredrickson, NP  Nada Boozer, NP  Georgie Chard, NP   Other Instructions

## 2020-01-10 NOTE — Progress Notes (Signed)
Elevated blood pressure Cardiology Office Note:    Date:  01/11/2020   ID:  Nathan [name], DOB [birth year], MRN [id number]  PCP:  System, Pcp Not In  Cardiologist:  Nathan NoeHenry [name] [name], MD   Referring MD: No ref. provider found   Chief Complaint  Patient presents with  . Cardiac Valve Problem    Mitral valve repair  . Advice Only    History of Present Illness:    Nathan [name] is a 59 y.o. male with a hx of moderate to severe mitral regurgitation, minimally invasive MV repair 06/24/19, prior PE, who returns for f/u and to discuss duration of anticoagulation.   Nathan [name] feels his breathing is finally improving.  He is back at work and handling the physical load without noticeable difficulty.  He denies orthopnea, PND, syncope, and lower extremity swelling.  He does have occasional instances of tachycardia that are random.  These episodes may last up to 10 minutes.  He does not feel lightheaded, dizzy, or short of breath.  He did not start the antihypertensive agent that we recommended.  He has been recording his blood pressures at home and they generally run between 120/80-100 40/90 2 mmHg.  He denies neurological symptoms.  He does not want to be on medications.  Past Medical History:  Diagnosis Date  . Allergic asthma   . Cough   . Epigastric pain   . Fast heart beat   . GERD (gastroesophageal reflux disease)   . Heartburn   . 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   . History of pulmonary embolus (PE)   . History of transesophageal echocardiography (TEE)    ABNORMAL  . Hx of colonic polyps   . Mitral valve regurgitation   . Murmur, cardiac   . Ringing in ear   . 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 FINGER SURGERY    . MITRAL VALVE REPAIR N/A 06/24/2019   Procedure: MINIMALLY INVASIVE MITRAL VALVE REPAIR (MVR) with a 32 Mitral Memo 4D;  Surgeon: Purcell Nailswen, Clarence H, MD;   [facility];  Service: Open Heart Surgery;  Laterality: N/A;  . RIGHT/LEFT HEART CATH AND CORONARY ANGIOGRAPHY N/A 06/17/2019   Procedure: RIGHT/LEFT HEART CATH AND CORONARY ANGIOGRAPHY;  Surgeon: Lyn RecordsSmith, Jillene Wehrenberg W, MD;  Location: [facility] INVASIVE CV LAB;  Service: Cardiovascular;  Laterality: N/A;  . SHOULDER SURGERY Left   . TEE WITHOUT CARDIOVERSION N/A 06/24/2019   Procedure: TRANSESOPHAGEAL ECHOCARDIOGRAM (TEE);  Surgeon: Purcell Nailswen, Clarence H, MD;  [facility];  Service: Open Heart Surgery;  Laterality: N/A;  . VASECTOMY      Current Medications: Current Meds  Medication Sig  . budesonide (RHINOCORT AQUA) 32 MCG/ACT nasal spray Place 2 sprays into both nostrils daily.   . budesonide-formoterol (SYMBICORT) 80-4.5 MCG/ACT inhaler Inhale 1-2 puffs into the lungs See admin instructions. Inhale 2 puffs in the morning, may inhale 1 puff during the day as needed for shortness of breath  . ferrous fumarate-b12-vitamic C-folic acid (TRINSICON / FOLTRIN) capsule Take 1 capsule by mouth daily with breakfast.  . pantoprazole (PROTONIX) 40 MG tablet Take 40 mg by mouth daily.  . [DISCONTINUED] amLODipine (NORVASC) 2.5 MG tablet Take 1 tablet (2.5 mg total) by mouth daily.     Allergies:   Oxycodone   Social History   Socioeconomic History  . Marital status: Married    Spouse name:  Not on file  . Number of children: Not on file  . Years of education: Not on file  . Highest education level: Not on file  Occupational History  . Not on file  Tobacco Use  . Smoking status: Never Smoker  . Smokeless tobacco: Never Used  Substance and Sexual Activity  . Alcohol use: Yes    Alcohol/week: 6.0 standard drinks    Types: 6 Glasses of wine per week  . Drug use: Never  . Sexual activity: Not on file    Comment: MARRIED  Other Topics Concern  . Not on file  Social History Narrative  . Not on file   Social Determinants of Health   Financial Resource Strain:   . Difficulty of Paying Living  Expenses:   Food Insecurity:   . Worried About Programme researcher, broadcasting/film/video in the Last Year:   . Barista in the Last Year:   Transportation Needs:   . Freight forwarder (Medical):   Marland Kitchen Lack of Transportation (Non-Medical):   Physical Activity:   . Days of Exercise per Week:   . Minutes of Exercise per Session:   Stress:   . Feeling of Stress :   Social Connections:   . Frequency of Communication with Friends and Family:   . Frequency of Social Gatherings with Friends and Family:   . Attends Religious Services:   . Active Member of Clubs or Organizations:   . Attends Banker Meetings:   Marland Kitchen Marital Status:      Family History: The patient's family history includes Diabetes in his mother.  ROS:   Please see the history of present illness.    Denies chills, fever, decreased appetite, weight loss, rash, and chest pain.  Does not want to be on medications.  All other systems reviewed and are negative.  EKGs/Labs/Other Studies Reviewed:    The following studies were reviewed today: No recent imaging.  EKG:  EKG an EKG is not performed today.  Recent Labs: 06/15/2019: NT-Pro BNP 39 06/19/2019: ALT 13 06/25/2019: Magnesium 2.2 07/13/2019: BUN 17; Creatinine, Ser 0.96; Hemoglobin 11.2; Platelets 351; Potassium 4.8; Sodium 141  Recent Lipid Panel No results found for: CHOL, TRIG, HDL, CHOLHDL, VLDL, LDLCALC, LDLDIRECT  Physical Exam:    VS:  BP (!) 132/94   Pulse 80   Ht 6\' 3"  (1.905 m)   Wt 218 lb 6.4 oz (99.1 kg)   SpO2 96%   BMI 27.30 kg/m     Wt Readings from Last 3 Encounters:  01/11/20 218 lb 6.4 oz (99.1 kg)  10/13/19 219 lb 12.8 oz (99.7 kg)  07/13/19 203 lb 1.9 oz (92.1 kg)     GEN: Tall, abdominal obesity.  Has gained weight since October.. No acute distress HEENT: Normal NECK: No JVD. LYMPHATICS: No lymphadenopathy CARDIAC:  RRR without murmur, gallop, or edema. VASCULAR:  Normal Pulses. No bruits. RESPIRATORY:  Clear to auscultation  without rales, wheezing or rhonchi  ABDOMEN: Soft, non-tender, non-distended, No pulsatile mass, MUSCULOSKELETAL: No deformity  SKIN: Warm and dry NEUROLOGIC:  Alert and oriented x 3 PSYCHIATRIC:  Normal affect   ASSESSMENT:    1. S/P minimally invasive mitral valve repair   2. SOB (shortness of breath)   3. Essential hypertension   4. Chronic anticoagulation   5. Mild persistent reactive airway disease without complication   6. Educated about COVID-19 virus infection   7. Palpitations    PLAN:    In order of problems  listed above:  1. Based upon clinical auscultation and condition, the patient is faring quite well since mitral valve repair.  We discussed endocarditis prophylaxis. 2. This is been an ongoing issue and was the reason for referral to valve surgery.  He had persistent shortness of breath after surgery although now states that it is significantly better.  I do believe that he has gotten over a period of deconditioning. 3. Blood pressure is not as well-controlled as it should be.  We discussed decreasing salt in diet.  I will take amlodipine 2.5 mg off his chart since he decided not to take the medication.  He will continue to monitor his blood pressure 2-3 times per week understanding that the target blood pressure should be less than 130/80 mmHg.  If he is consistently above this, he will let us know and therapy may be required. 4. He is not currently on anticoagulation therapy.  He is having occasional palpitation.  We will perform a 30-day monitor to exclude atrial fibrillation 5. This is being treated with Symbicort.  He feels breathing is better. 6. He has received the COVID-19 vaccine.  He is practicing social distancing. 7. 30-day monitor to rule out atrial fibrillation.   Medication Adjustments/Labs and Tests Ordered: Current medicines are reviewed at length with the patient today.  Concerns regarding medicines are outlined above.  Orders Placed This Encounter   Procedures  . Cardiac event monitor   Meds ordered this encounter  Medications  . aspirin EC 81 MG tablet    Sig: Take 1 tablet (81 mg total) by mouth daily.    Dispense:  90 tablet    Refill:  3    Patient Instructions  Medication Instructions:  1) START Aspirin 81mg  once daily  *If you need a refill on your cardiac medications before your next appointment, please call your pharmacy*   Lab Work: None If you have labs (blood work) drawn today and your tests are completely normal, you will receive your results only by: MyChart Message (if you have MyChart) OR . A paper copy in the mail If you have any lab test that is abnormal or we need to change your treatment, we will call you to review the results.   Testing/Procedures: Your physician has recommended that you wear an event monitor. Event monitors are medical devices that record the heart's electrical activity. Doctors most often Marland Kitchen these monitors to diagnose arrhythmias. Arrhythmias are problems with the speed or rhythm of the heartbeat. The monitor is a small, portable device. You can wear one while you do your normal daily activities. This is usually used to diagnose what is causing palpitations/syncope (passing out).    Follow-Up: At [facility], you and your health needs are our priority.  As part of our continuing mission to provide you with exceptional heart care, we have created designated Provider Care Teams.  These Care Teams include your primary Cardiologist (physician) and Advanced Practice Providers (APPs -  Physician Assistants and Nurse Practitioners) who all work together to provide you with the care you need, when you need it.  We recommend signing up for the patient portal called "MyChart".  Sign up information is provided on this After Visit Summary.  MyChart is used to connect with patients for Virtual Visits (Telemedicine).  Patients are able to view lab/test results, encounter notes, upcoming  appointments, etc.  Non-urgent messages can be sent to your provider as well.   To learn more about what you can do  with MyChart, go to [email or web address].    Your next appointment:   6 month(s)  The format for your next appointment:   In Person  Provider:   You may see Nathan Noe, MD or one of the following Advanced Practice Providers on your designated Care Team:    Norma Fredrickson, NP  Nada Boozer, NP  Georgie Chard, NP    Other Instructions  Check your blood pressure at least 2-3 times per month.  If it is consistently 130/80 or higher, please contact the office.    Low-Sodium Eating Plan Sodium, which is an element that makes up salt, helps you maintain a healthy balance of fluids in your body. Too much sodium can increase your blood pressure and cause fluid and waste to be held in your body. Your health care provider or dietitian may recommend following this plan if you have high blood pressure (hypertension), kidney disease, liver disease, or heart failure. Eating less sodium can help lower your blood pressure, reduce swelling, and protect your heart, liver, and kidneys. What are tips for following this plan? General guidelines  Most people on this plan should limit their sodium intake to 1,500-2,000 mg (milligrams) of sodium each day. Reading food labels   The Nutrition Facts label lists the amount of sodium in one serving of the food. If you eat more than one serving, you must multiply the listed amount of sodium by the number of servings.  Choose foods with less than 140 mg of sodium per serving.  Avoid foods with 300 mg of sodium or more per serving. Shopping  Look for lower-sodium products, often labeled as "low-sodium" or "no salt added."  Always check the sodium content even if foods are labeled as "unsalted" or "no salt added".  Buy fresh foods. ? Avoid canned foods and premade or frozen meals. ? Avoid canned, cured, or processed meats  Buy  breads that have less than 80 mg of sodium per slice. Cooking  Eat more home-cooked food and less restaurant, buffet, and fast food.  Avoid adding salt when cooking. Use salt-free seasonings or herbs instead of table salt or sea salt. Check with your health care provider or pharmacist before using salt substitutes.  Cook with plant-based oils, such as canola, sunflower, or olive oil. Meal planning  When eating at a restaurant, ask that your food be prepared with less salt or no salt, if possible.  Avoid foods that contain MSG (monosodium glutamate). MSG is sometimes added to [place] food, bouillon, and some canned foods. What foods are recommended? The items listed may not be a complete list. Talk with your dietitian about what dietary choices are best for you. Grains Low-sodium cereals, including oats, puffed wheat and rice, and shredded wheat. Low-sodium crackers. Unsalted rice. Unsalted pasta. Low-sodium bread. Whole-grain breads and whole-grain pasta. Vegetables Fresh or frozen vegetables. "No salt added" canned vegetables. "No salt added" tomato sauce and paste. Low-sodium or reduced-sodium tomato and vegetable juice. Fruits Fresh, frozen, or canned fruit. Fruit juice. Meats and other protein foods Fresh or frozen (no salt added) meat, poultry, seafood, and fish. Low-sodium canned tuna and salmon. Unsalted nuts. Dried peas, beans, and lentils without added salt. Unsalted canned beans. Eggs. Unsalted nut butters. Dairy Milk. Soy milk. Cheese that is naturally low in sodium, such as ricotta cheese, fresh mozzarella, or Swiss cheese Low-sodium or reduced-sodium cheese. Cream cheese. Yogurt. Fats and oils Unsalted butter. Unsalted margarine with no trans fat. Vegetable oils such as canola or olive  oils. Seasonings and other foods Fresh and dried herbs and spices. Salt-free seasonings. Low-sodium mustard and ketchup. Sodium-free salad dressing. Sodium-free light mayonnaise. Fresh or  refrigerated horseradish. Lemon juice. Vinegar. Homemade, reduced-sodium, or low-sodium soups. Unsalted popcorn and pretzels. Low-salt or salt-free chips. What foods are not recommended? The items listed may not be a complete list. Talk with your dietitian about what dietary choices are best for you. Grains Instant hot cereals. Bread stuffing, pancake, and biscuit mixes. Croutons. Seasoned rice or pasta mixes. Noodle soup cups. Boxed or frozen macaroni and cheese. Regular salted crackers. Self-rising flour. Vegetables Sauerkraut, pickled vegetables, and relishes. Olives. [place] fries. Onion rings. Regular canned vegetables (not low-sodium or reduced-sodium). Regular canned tomato sauce and paste (not low-sodium or reduced-sodium). Regular tomato and vegetable juice (not low-sodium or reduced-sodium). Frozen vegetables in sauces. Meats and other protein foods Meat or fish that is salted, canned, smoked, spiced, or pickled. Bacon, ham, sausage, hotdogs, corned beef, chipped beef, packaged lunch meats, salt pork, jerky, pickled herring, anchovies, regular canned tuna, sardines, salted nuts. Dairy Processed cheese and cheese spreads. Cheese curds. Blue cheese. Feta cheese. String cheese. Regular cottage cheese. Buttermilk. Canned milk. Fats and oils Salted butter. Regular margarine. Ghee. Bacon fat. Seasonings and other foods Onion salt, garlic salt, seasoned salt, table salt, and sea salt. Canned and packaged gravies. Worcestershire sauce. Tartar sauce. Barbecue sauce. Teriyaki sauce. Soy sauce, including reduced-sodium. Steak sauce. Fish sauce. Oyster sauce. Cocktail sauce. Horseradish that you find on the shelf. Regular ketchup and mustard. Meat flavorings and tenderizers. Bouillon cubes. Hot sauce and Tabasco sauce. Premade or packaged marinades. Premade or packaged taco seasonings. Relishes. Regular salad dressings. Salsa. Potato and tortilla chips. Corn chips and puffs. Salted popcorn and pretzels.  Canned or dried soups. Pizza. Frozen entrees and pot pies. Summary  Eating less sodium can help lower your blood pressure, reduce swelling, and protect your heart, liver, and kidneys.  Most people on this plan should limit their sodium intake to 1,500-2,000 mg (milligrams) of sodium each day.  Canned, boxed, and frozen foods are high in sodium. Restaurant foods, fast foods, and pizza are also very high in sodium. You also get sodium by adding salt to food.  Try to cook at home, eat more fresh fruits and vegetables, and eat less fast food, canned, processed, or prepared foods. This information is not intended to replace advice given to you by your health care provider. Make sure you discuss any questions you have with your health care provider. Document Revised: 08/16/2017 Document Reviewed: 08/27/2016 Elsevier Patient Education  2020 [employer].      Signed, Sinclair Grooms, MD  01/11/2020 11:15 AM    [place]

## 2020-01-11 ENCOUNTER — Encounter: Payer: Self-pay | Admitting: *Deleted

## 2020-01-11 ENCOUNTER — Encounter: Payer: Self-pay | Admitting: Interventional Cardiology

## 2020-01-11 ENCOUNTER — Ambulatory Visit (INDEPENDENT_AMBULATORY_CARE_PROVIDER_SITE_OTHER): Payer: PRIVATE HEALTH INSURANCE | Admitting: Interventional Cardiology

## 2020-01-11 ENCOUNTER — Other Ambulatory Visit: Payer: Self-pay

## 2020-01-11 VITALS — BP 132/94 | HR 80 | Ht 75.0 in | Wt 218.4 lb

## 2020-01-11 DIAGNOSIS — Z7901 Long term (current) use of anticoagulants: Secondary | ICD-10-CM | POA: Diagnosis not present

## 2020-01-11 DIAGNOSIS — I10 Essential (primary) hypertension: Secondary | ICD-10-CM | POA: Diagnosis not present

## 2020-01-11 DIAGNOSIS — R0602 Shortness of breath: Secondary | ICD-10-CM

## 2020-01-11 DIAGNOSIS — Z9889 Other specified postprocedural states: Secondary | ICD-10-CM

## 2020-01-11 DIAGNOSIS — R002 Palpitations: Secondary | ICD-10-CM

## 2020-01-11 DIAGNOSIS — J4530 Mild persistent asthma, uncomplicated: Secondary | ICD-10-CM

## 2020-01-11 DIAGNOSIS — Z7189 Other specified counseling: Secondary | ICD-10-CM

## 2020-01-11 MED ORDER — ASPIRIN EC 81 MG PO TBEC: 81.0000 mg | DELAYED_RELEASE_TABLET | Freq: Every day | ORAL | 3 refills | Status: AC

## 2020-01-11 NOTE — Progress Notes (Signed)
Patient ID: Nathan [name], male   DOB: 12-Aug-1961, 59 y.o.   MRN: [id number] Patient enrolled for [employer] to ship a 30 day cardiac event monitor to his home.  Instructions mailed to patient.

## 2020-01-11 NOTE — Patient Instructions (Signed)
Medication Instructions:  1) START Aspirin 81mg  once daily  *If you need a refill on your cardiac medications before your next appointment, please call your pharmacy*   Lab Work: None If you have labs (blood work) drawn today and your tests are completely normal, you will receive your results only by: MyChart Message (if you have MyChart) OR . A paper copy in the mail If you have any lab test that is abnormal or we need to change your treatment, we will call you to review the results.   Testing/Procedures: Your physician has recommended that you wear an event monitor. Event monitors are medical devices that record the heart's electrical activity. Doctors most often Marland Kitchen these monitors to diagnose arrhythmias. Arrhythmias are problems with the speed or rhythm of the heartbeat. The monitor is a small, portable device. You can wear one while you do your normal daily activities. This is usually used to diagnose what is causing palpitations/syncope (passing out).    Follow-Up: At [facility], you and your health needs are our priority.  As part of our continuing mission to provide you with exceptional heart care, we have created designated Provider Care Teams.  These Care Teams include your primary Cardiologist (physician) and Advanced Practice Providers (APPs -  Physician Assistants and Nurse Practitioners) who all work together to provide you with the care you need, when you need it.  We recommend signing up for the patient portal called "MyChart".  Sign up information is provided on this After Visit Summary.  MyChart is used to connect with patients for Virtual Visits (Telemedicine).  Patients are able to view lab/test results, encounter notes, upcoming appointments, etc.  Non-urgent messages can be sent to your provider as well.   To learn more about what you can do with MyChart, go to [facility].    Your next appointment:   6 month(s)  The format for your next appointment:    In Person  Provider:   You may see [email or web address], MD or one of the following Advanced Practice Providers on your designated Care Team:    Lesleigh Noe, NP  Norma Fredrickson, NP  Nada Boozer, NP    Other Instructions  Check your blood pressure at least 2-3 times per month.  If it is consistently 130/80 or higher, please contact the office.    Low-Sodium Eating Plan Sodium, which is an element that makes up salt, helps you maintain a healthy balance of fluids in your body. Too much sodium can increase your blood pressure and cause fluid and waste to be held in your body. Your health care provider or dietitian may recommend following this plan if you have high blood pressure (hypertension), kidney disease, liver disease, or heart failure. Eating less sodium can help lower your blood pressure, reduce swelling, and protect your heart, liver, and kidneys. What are tips for following this plan? General guidelines  Most people on this plan should limit their sodium intake to 1,500-2,000 mg (milligrams) of sodium each day. Reading food labels   The Nutrition Facts label lists the amount of sodium in one serving of the food. If you eat more than one serving, you must multiply the listed amount of sodium by the number of servings.  Choose foods with less than 140 mg of sodium per serving.  Avoid foods with 300 mg of sodium or more per serving. Shopping  Look for lower-sodium products, often labeled as "low-sodium" or "no salt added."  Always check the sodium  content even if foods are labeled as "unsalted" or "no salt added".  Buy fresh foods. ? Avoid canned foods and premade or frozen meals. ? Avoid canned, cured, or processed meats  Buy breads that have less than 80 mg of sodium per slice. Cooking  Eat more home-cooked food and less restaurant, buffet, and fast food.  Avoid adding salt when cooking. Use salt-free seasonings or herbs instead of table salt or sea salt. Check  with your health care provider or pharmacist before using salt substitutes.  Cook with plant-based oils, such as canola, sunflower, or olive oil. Meal planning  When eating at a restaurant, ask that your food be prepared with less salt or no salt, if possible.  Avoid foods that contain MSG (monosodium glutamate). MSG is sometimes added to [place] food, bouillon, and some canned foods. What foods are recommended? The items listed may not be a complete list. Talk with your dietitian about what dietary choices are best for you. Grains Low-sodium cereals, including oats, puffed wheat and rice, and shredded wheat. Low-sodium crackers. Unsalted rice. Unsalted pasta. Low-sodium bread. Whole-grain breads and whole-grain pasta. Vegetables Fresh or frozen vegetables. "No salt added" canned vegetables. "No salt added" tomato sauce and paste. Low-sodium or reduced-sodium tomato and vegetable juice. Fruits Fresh, frozen, or canned fruit. Fruit juice. Meats and other protein foods Fresh or frozen (no salt added) meat, poultry, seafood, and fish. Low-sodium canned tuna and salmon. Unsalted nuts. Dried peas, beans, and lentils without added salt. Unsalted canned beans. Eggs. Unsalted nut butters. Dairy Milk. Soy milk. Cheese that is naturally low in sodium, such as ricotta cheese, fresh mozzarella, or Swiss cheese Low-sodium or reduced-sodium cheese. Cream cheese. Yogurt. Fats and oils Unsalted butter. Unsalted margarine with no trans fat. Vegetable oils such as canola or olive oils. Seasonings and other foods Fresh and dried herbs and spices. Salt-free seasonings. Low-sodium mustard and ketchup. Sodium-free salad dressing. Sodium-free light mayonnaise. Fresh or refrigerated horseradish. Lemon juice. Vinegar. Homemade, reduced-sodium, or low-sodium soups. Unsalted popcorn and pretzels. Low-salt or salt-free chips. What foods are not recommended? The items listed may not be a complete list. Talk with your  dietitian about what dietary choices are best for you. Grains Instant hot cereals. Bread stuffing, pancake, and biscuit mixes. Croutons. Seasoned rice or pasta mixes. Noodle soup cups. Boxed or frozen macaroni and cheese. Regular salted crackers. Self-rising flour. Vegetables Sauerkraut, pickled vegetables, and relishes. Olives. [place] fries. Onion rings. Regular canned vegetables (not low-sodium or reduced-sodium). Regular canned tomato sauce and paste (not low-sodium or reduced-sodium). Regular tomato and vegetable juice (not low-sodium or reduced-sodium). Frozen vegetables in sauces. Meats and other protein foods Meat or fish that is salted, canned, smoked, spiced, or pickled. Bacon, ham, sausage, hotdogs, corned beef, chipped beef, packaged lunch meats, salt pork, jerky, pickled herring, anchovies, regular canned tuna, sardines, salted nuts. Dairy Processed cheese and cheese spreads. Cheese curds. Blue cheese. Feta cheese. String cheese. Regular cottage cheese. Buttermilk. Canned milk. Fats and oils Salted butter. Regular margarine. Ghee. Bacon fat. Seasonings and other foods Onion salt, garlic salt, seasoned salt, table salt, and sea salt. Canned and packaged gravies. Worcestershire sauce. Tartar sauce. Barbecue sauce. Teriyaki sauce. Soy sauce, including reduced-sodium. Steak sauce. Fish sauce. Oyster sauce. Cocktail sauce. Horseradish that you find on the shelf. Regular ketchup and mustard. Meat flavorings and tenderizers. Bouillon cubes. Hot sauce and Tabasco sauce. Premade or packaged marinades. Premade or packaged taco seasonings. Relishes. Regular salad dressings. Salsa. Potato and tortilla chips. Corn chips and  puffs. Salted popcorn and pretzels. Canned or dried soups. Pizza. Frozen entrees and pot pies. Summary  Eating less sodium can help lower your blood pressure, reduce swelling, and protect your heart, liver, and kidneys.  Most people on this plan should limit their sodium intake to  1,500-2,000 mg (milligrams) of sodium each day.  Canned, boxed, and frozen foods are high in sodium. Restaurant foods, fast foods, and pizza are also very high in sodium. You also get sodium by adding salt to food.  Try to cook at home, eat more fresh fruits and vegetables, and eat less fast food, canned, processed, or prepared foods. This information is not intended to replace advice given to you by your health care provider. Make sure you discuss any questions you have with your health care provider. Document Revised: 08/16/2017 Document Reviewed: 08/27/2016 Elsevier Patient Education  2020 [employer].

## 2020-01-18 ENCOUNTER — Encounter (INDEPENDENT_AMBULATORY_CARE_PROVIDER_SITE_OTHER): Payer: PRIVATE HEALTH INSURANCE

## 2020-01-18 DIAGNOSIS — R002 Palpitations: Secondary | ICD-10-CM | POA: Diagnosis not present

## 2020-07-04 ENCOUNTER — Telehealth: Payer: PRIVATE HEALTH INSURANCE | Admitting: Thoracic Surgery (Cardiothoracic Vascular Surgery)

## 2020-07-05 IMAGING — DX DG CHEST 1V PORT
1 series · 1 of 1 positions shown · non-contrast
Comparison: [DATE]

CLINICAL DATA: Status post mitral valve repair.

EXAM:
PORTABLE CHEST 1 VIEW

[chest]
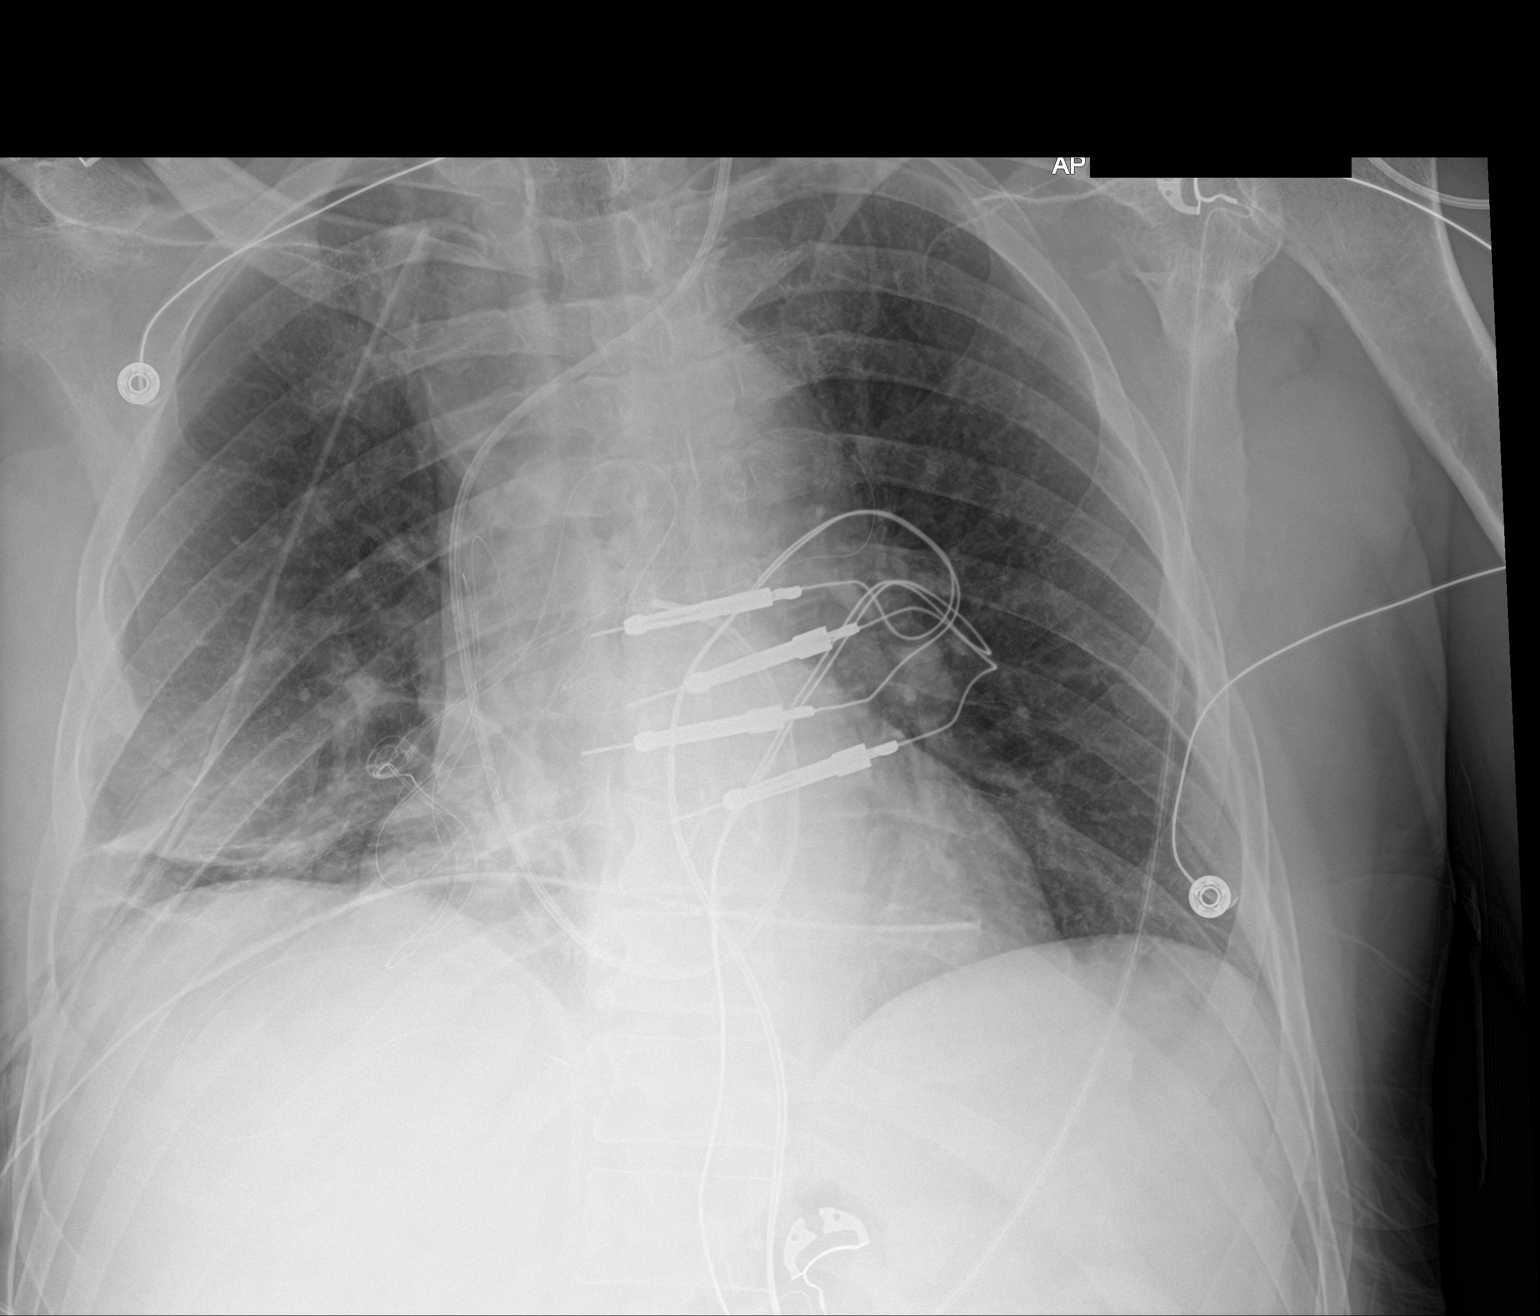

[1 of 1 positions shown; findings below may reference images not displayed]

FINDINGS: 5061 hours. Left IJ pulmonary artery catheter tip is in the right
main pulmonary artery. Right chest tube tip projects in the right
lung apex. Presumed mediastinal/pericardial drain crosses the
midline with the tip overlying the cardiac apex. Telemetry leads
overlie the chest.

Basilar atelectasis noted on the right. No evidence for right
pneumothorax. Cardiopericardial silhouette is at upper limits of
normal for size.
IMPRESSION: Basilar atelectasis on the right without evidence for pneumothorax.

## 2020-07-10 IMAGING — CR DG CHEST 2V
2 series · 2 of 2 positions shown · non-contrast
Comparison: 06/27/2019

CLINICAL DATA: 58-year-old male with pneumothorax status post
minimally invasive mitral valve repair

EXAM:
CHEST - 2 VIEW

[chest pa]
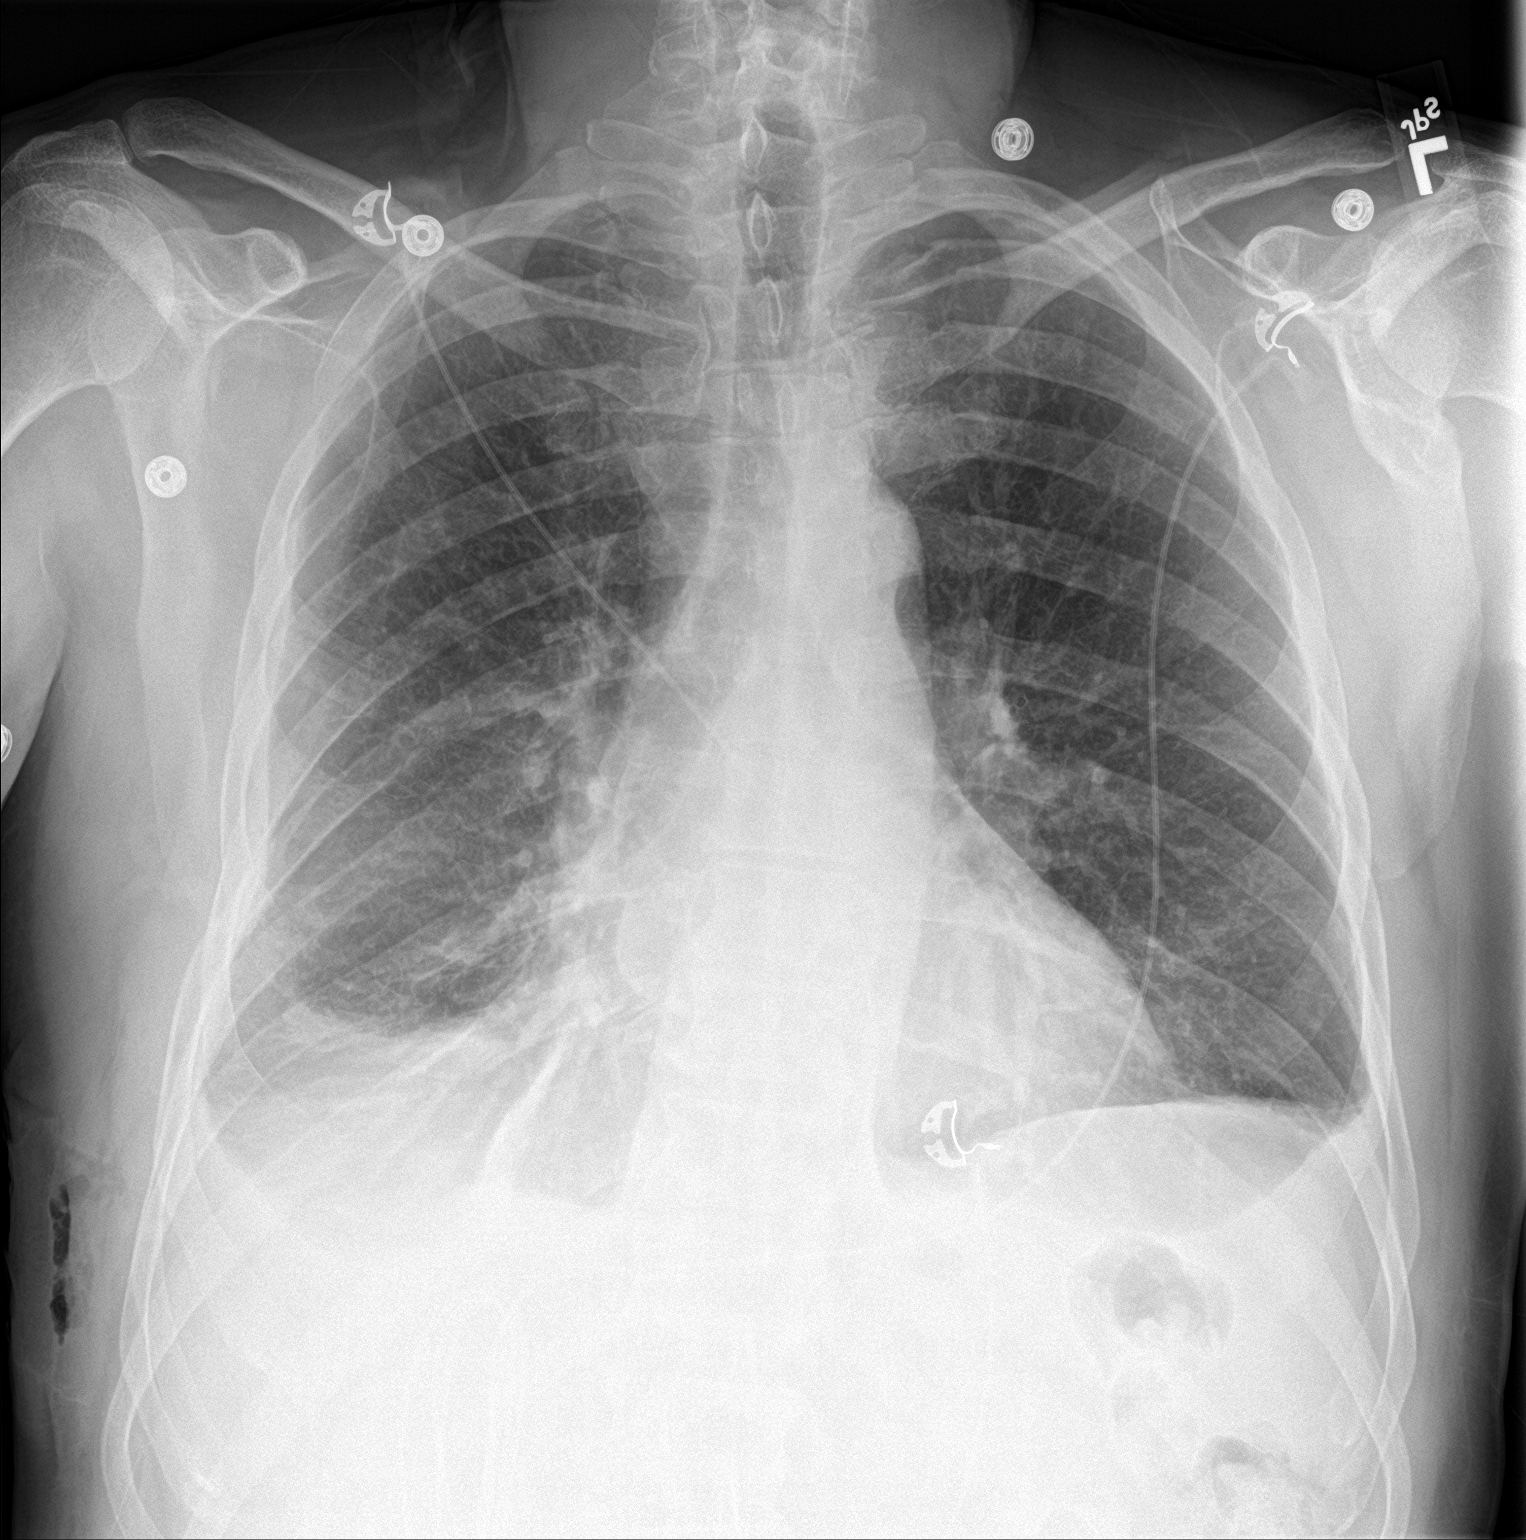

[chest lat]
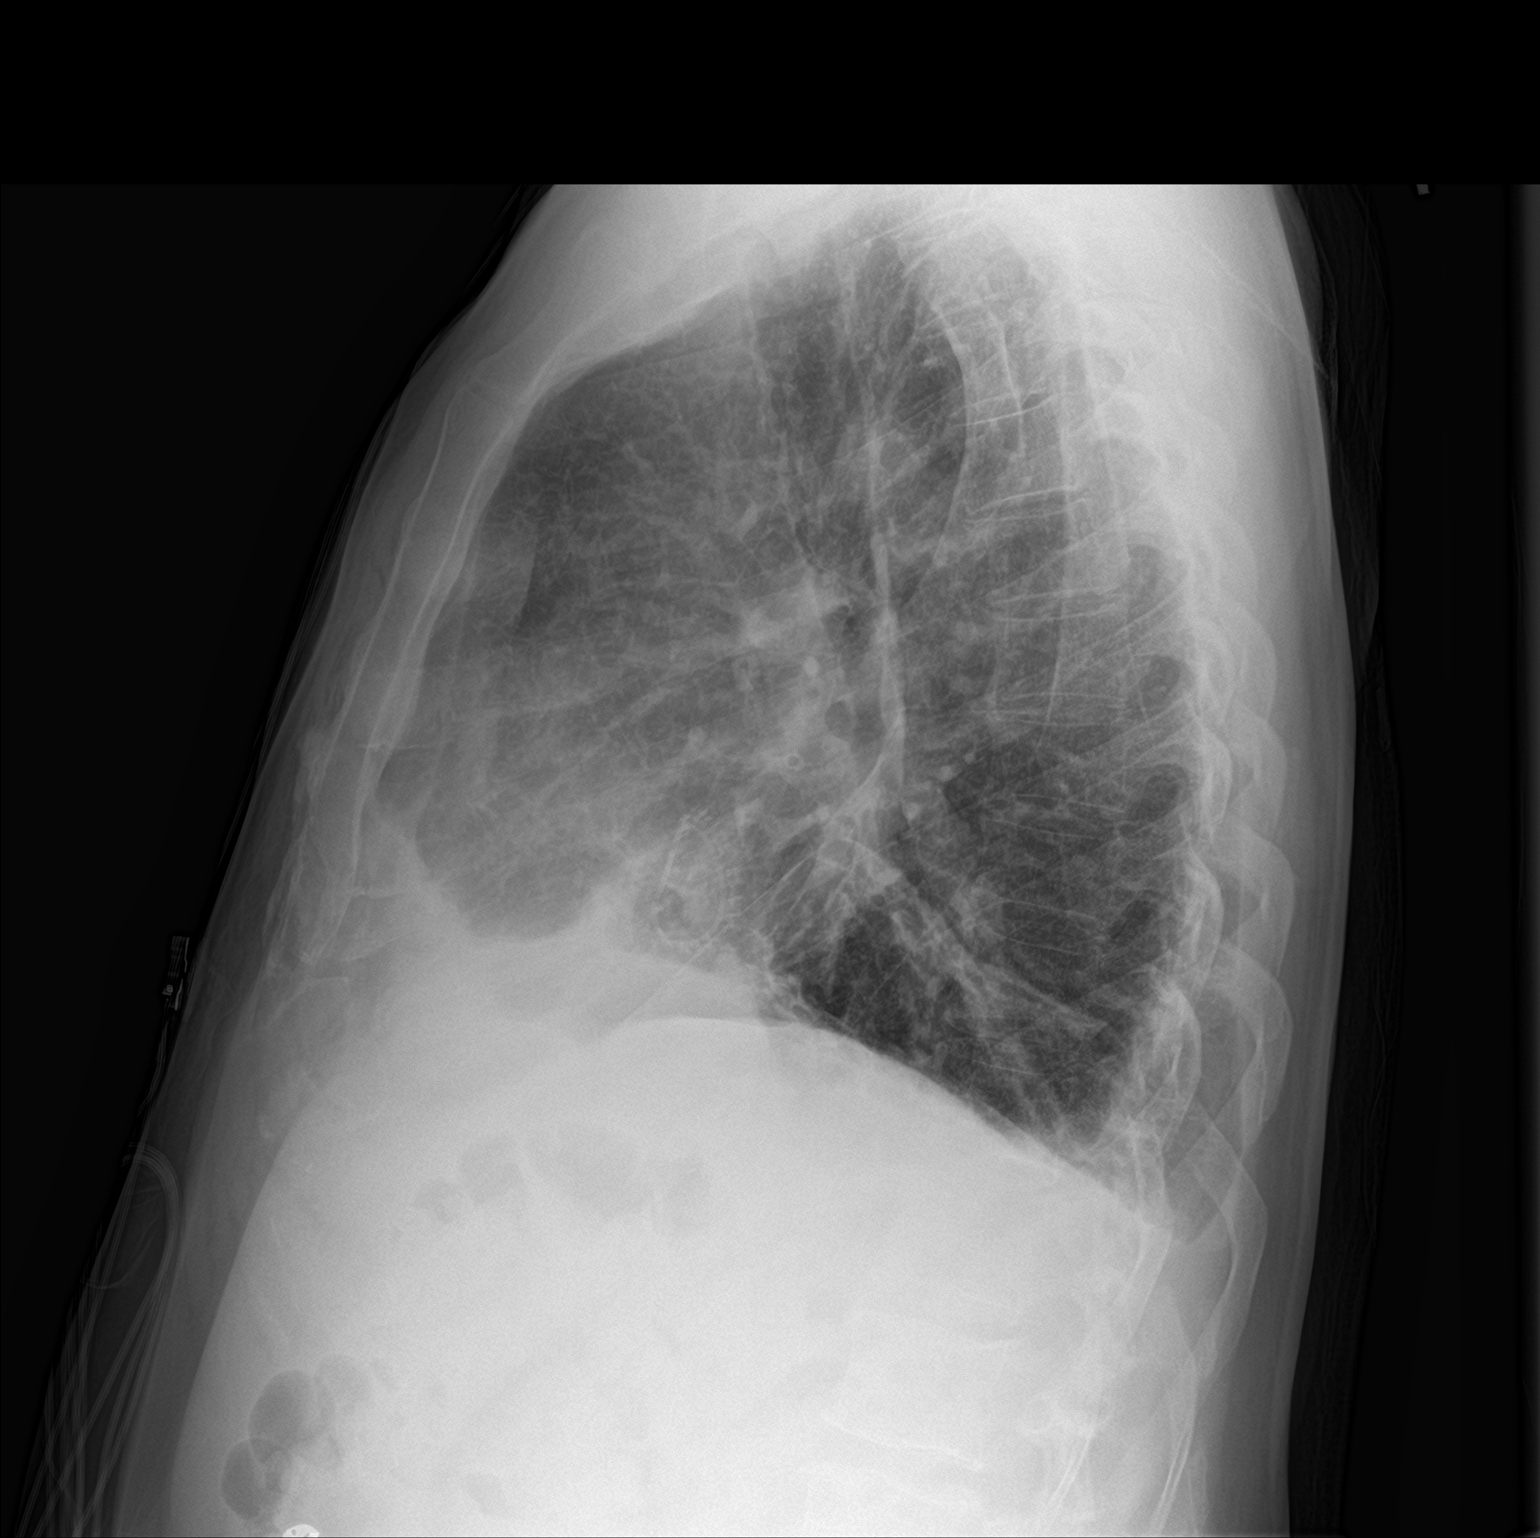

[2 of 2 positions shown; findings below may reference images not displayed]

FINDINGS: Cardiomediastinal silhouette unchanged in size and contour.

Interval removal of right-sided thoracostomy tube. There is a tiny
right apical pneumothorax.

Blunting of the right costophrenic angle and left costophrenic angle
corresponds to meniscus on the lateral view.

Surgical changes of mitral valve annuloplasty.

Coarsened interstitial markings similar to prior with no evidence of
frank edema.
IMPRESSION: Interval removal of right-sided chest tube with tiny right apically
pneumothorax.

Trace bilateral pleural effusions with associated atelectasis.

Surgical changes of mitral valve annuloplasty.

## 2020-07-17 IMAGING — DX DG CHEST 2V
2 series · 2 of 2 positions shown · non-contrast
Comparison: 06/29/2019

CLINICAL DATA: Status post minimally invasive mitral valve repair
on 06/24/2019.

EXAM:
CHEST - 2 VIEW

[dg chest 2 view (1 of 2)]
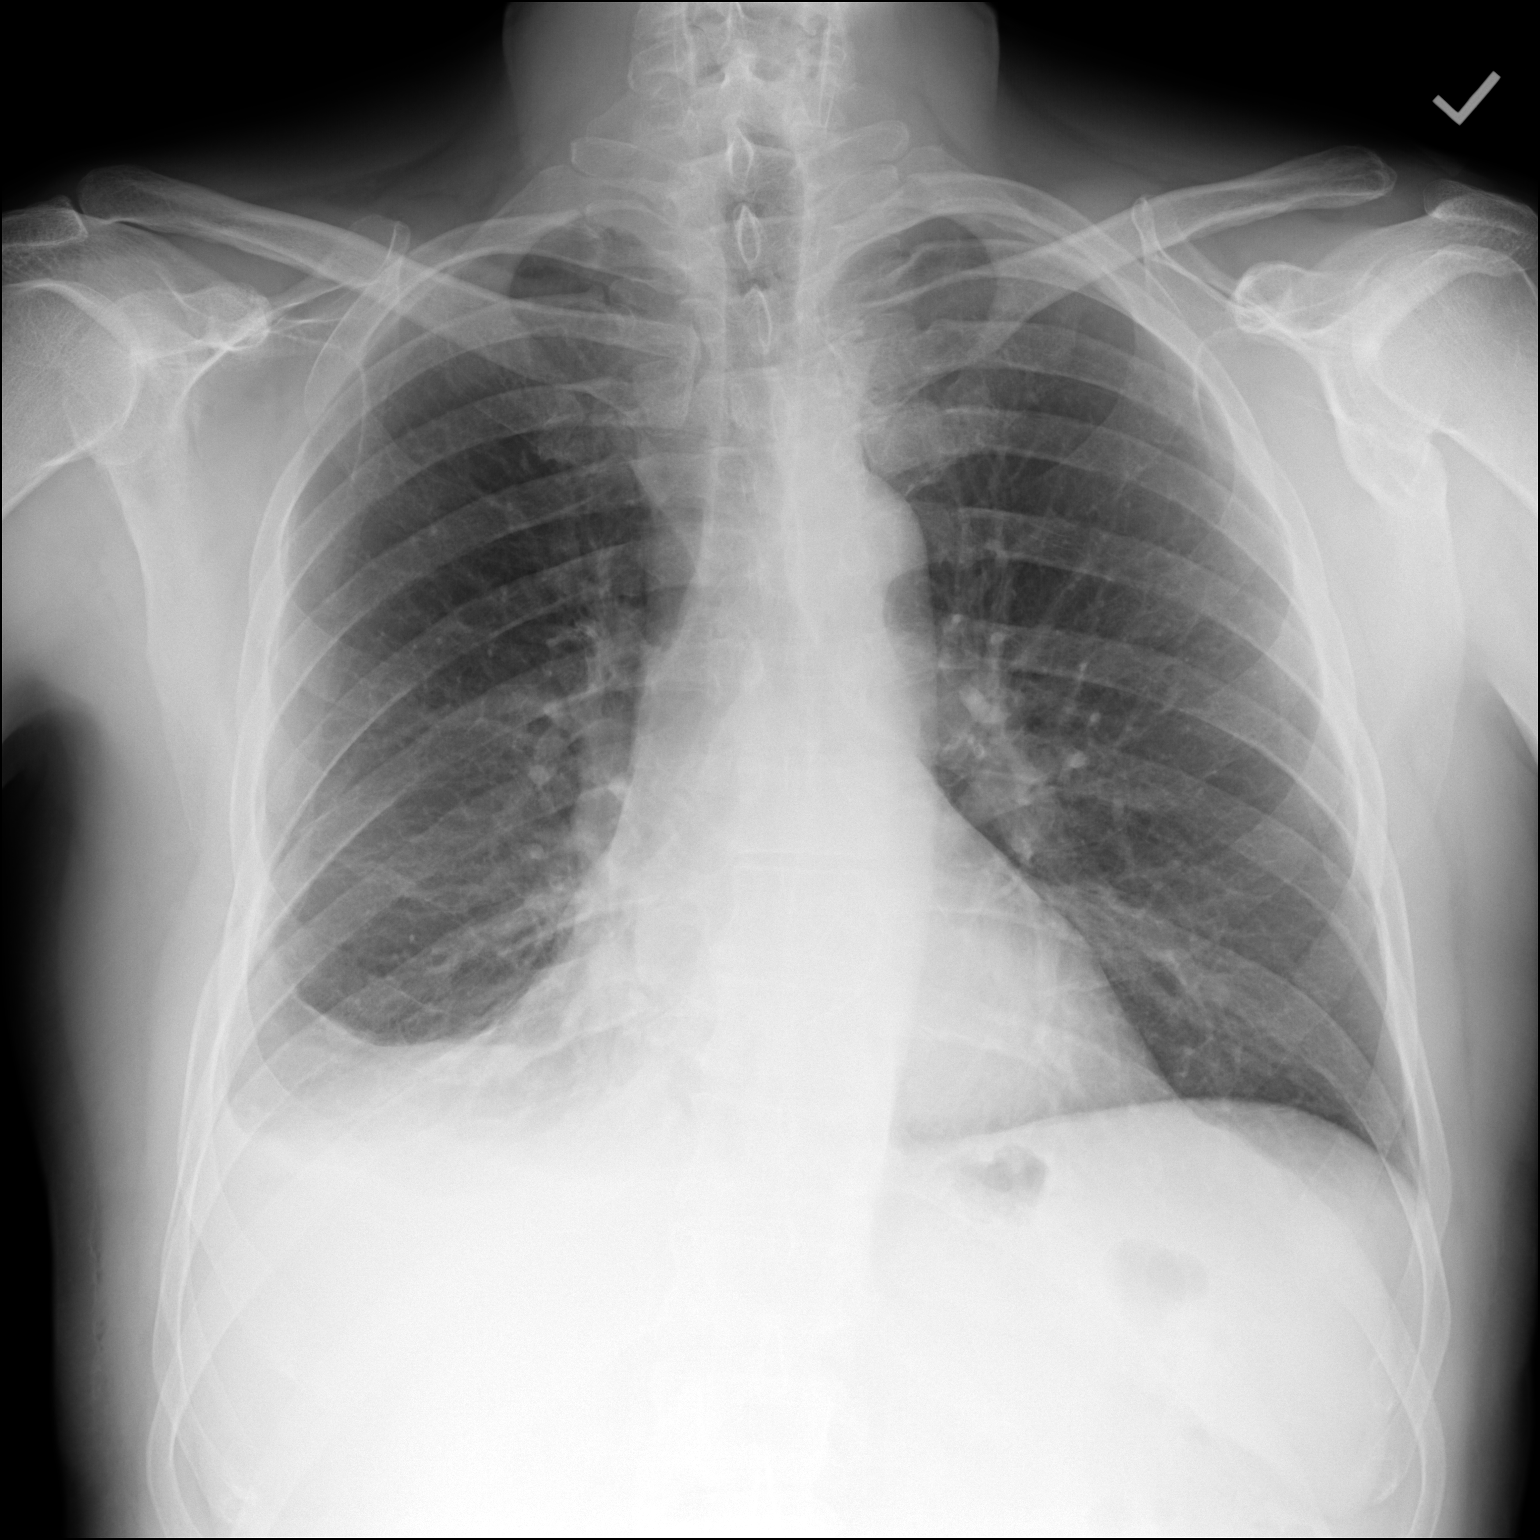

[dg chest 2 view (2 of 2)]
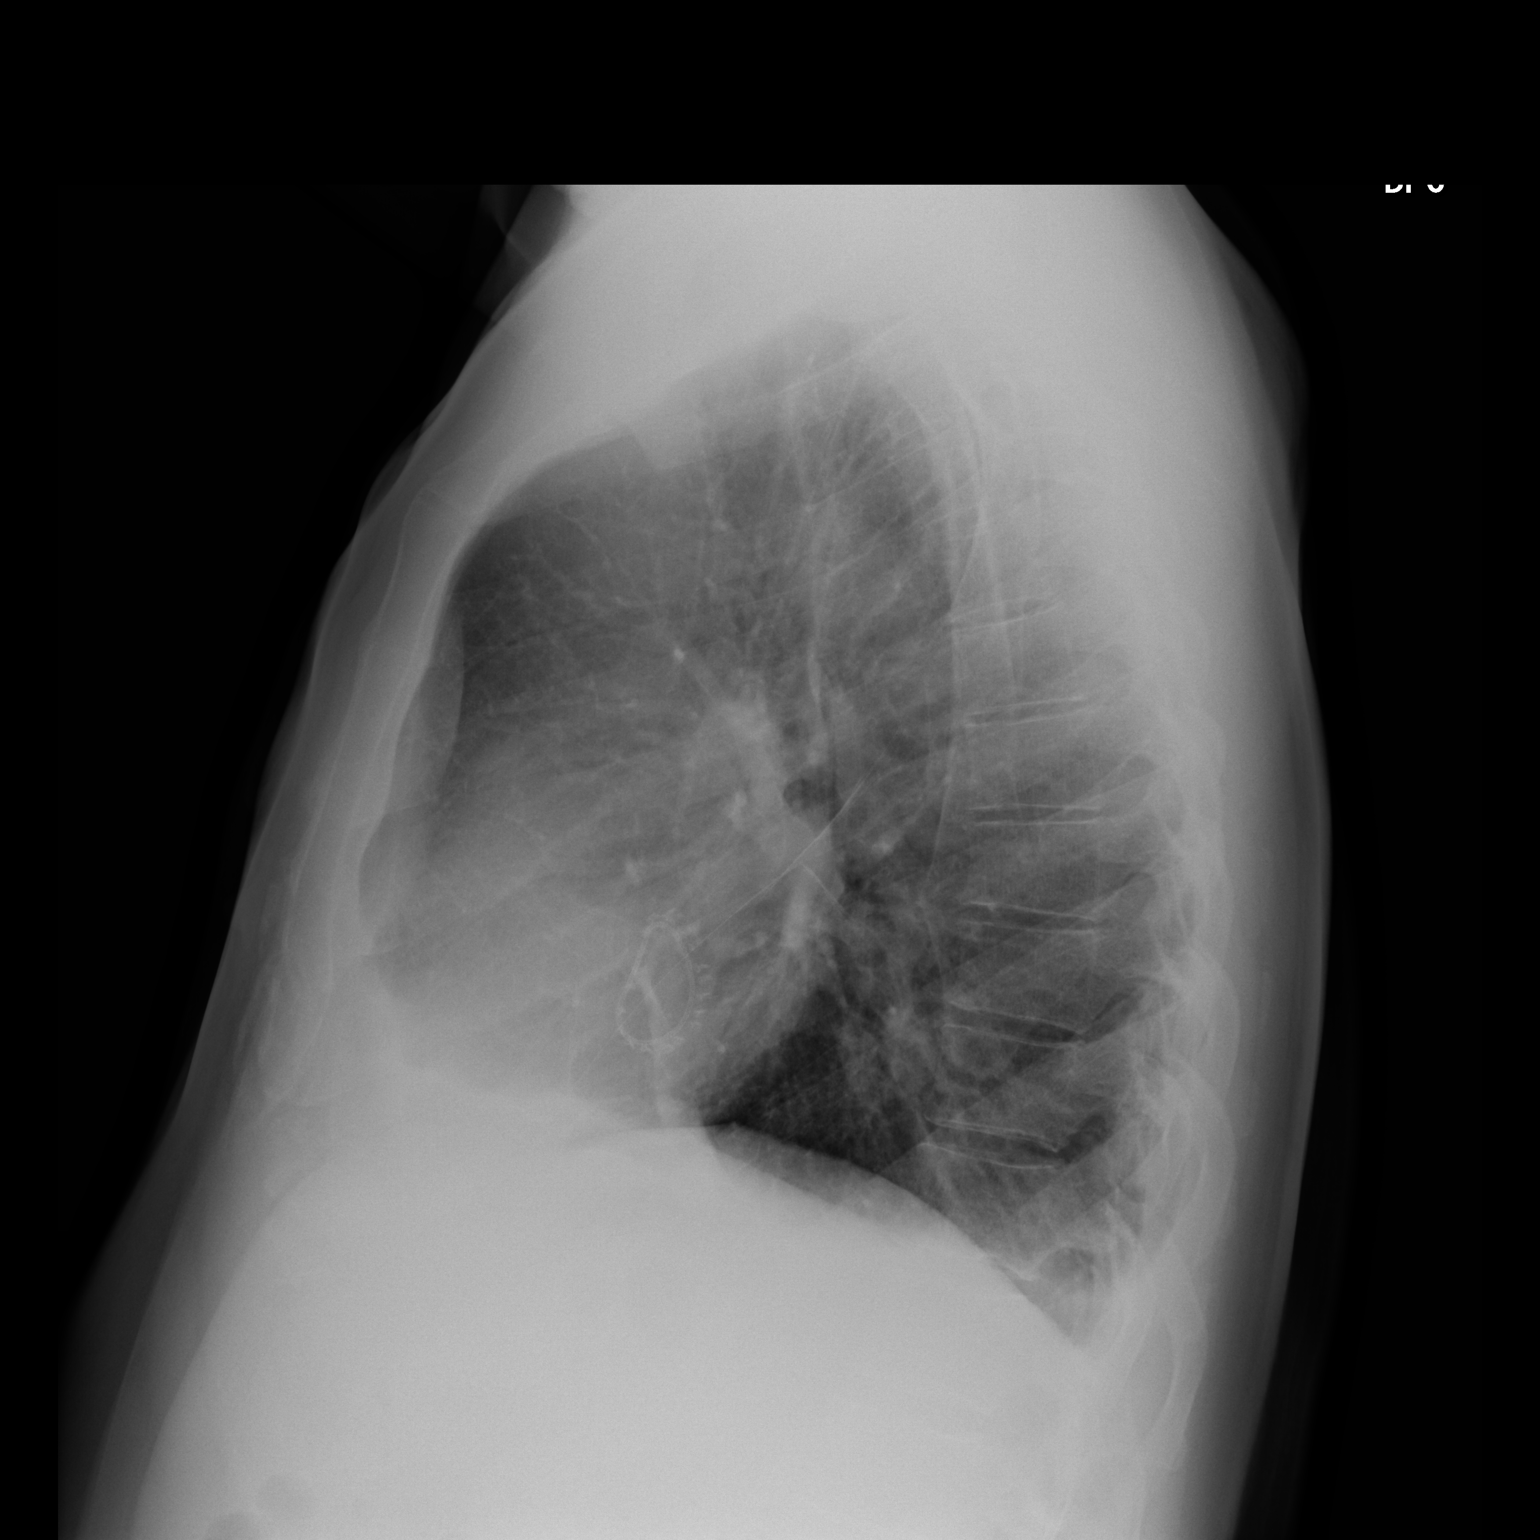

[2 of 2 positions shown; findings below may reference images not displayed]

FINDINGS: Normal sized heart. Tortuous aorta. Resolved left pleural fluid. No
significant change in a small right pleural effusion and associated
mild right basilar atelectasis. Stable post mitral valve
annuloplasty changes. Unremarkable bones.
IMPRESSION: 1. Stable small right pleural effusion and mild right basilar
atelectasis.
2. Resolved left pleural fluid.
